# Patient Record
Sex: Male | Born: 1965 | Race: Black or African American | Hispanic: No | Marital: Married | Smoking: Never smoker
Health system: Southern US, Community
[De-identification: ages and names within clinical notes are randomized; demographics above are authoritative.]

## PROBLEM LIST (undated history)

## (undated) DIAGNOSIS — I10 Essential (primary) hypertension: Secondary | ICD-10-CM

## (undated) DIAGNOSIS — K219 Gastro-esophageal reflux disease without esophagitis: Secondary | ICD-10-CM

## (undated) HISTORY — PX: OTHER SURGICAL HISTORY: SHX169

---

## 1998-04-24 ENCOUNTER — Ambulatory Visit (HOSPITAL_BASED_OUTPATIENT_CLINIC_OR_DEPARTMENT_OTHER): Admission: RE | Admit: 1998-04-24 | Discharge: 1998-04-24 | Payer: Self-pay | Admitting: *Deleted

## 2000-06-21 ENCOUNTER — Emergency Department (HOSPITAL_COMMUNITY): Admission: EM | Admit: 2000-06-21 | Discharge: 2000-06-21 | Payer: Self-pay | Admitting: Emergency Medicine

## 2000-06-21 ENCOUNTER — Encounter: Payer: Self-pay | Admitting: Emergency Medicine

## 2001-05-06 ENCOUNTER — Emergency Department (HOSPITAL_COMMUNITY): Admission: EM | Admit: 2001-05-06 | Discharge: 2001-05-06 | Payer: Self-pay

## 2001-05-08 ENCOUNTER — Emergency Department (HOSPITAL_COMMUNITY): Admission: EM | Admit: 2001-05-08 | Discharge: 2001-05-09 | Payer: Self-pay | Admitting: Emergency Medicine

## 2002-03-14 ENCOUNTER — Ambulatory Visit (HOSPITAL_COMMUNITY): Admission: RE | Admit: 2002-03-14 | Discharge: 2002-03-14 | Payer: Self-pay | Admitting: Family Medicine

## 2002-03-14 ENCOUNTER — Encounter: Payer: Self-pay | Admitting: Family Medicine

## 2002-03-23 ENCOUNTER — Emergency Department (HOSPITAL_COMMUNITY): Admission: EM | Admit: 2002-03-23 | Discharge: 2002-03-23 | Payer: Self-pay | Admitting: Emergency Medicine

## 2007-08-22 ENCOUNTER — Emergency Department (HOSPITAL_COMMUNITY): Admission: EM | Admit: 2007-08-22 | Discharge: 2007-08-23 | Payer: Self-pay | Admitting: Emergency Medicine

## 2010-07-27 ENCOUNTER — Emergency Department (HOSPITAL_COMMUNITY): Admission: EM | Admit: 2010-07-27 | Discharge: 2010-07-27 | Payer: Self-pay | Admitting: Emergency Medicine

## 2010-07-30 ENCOUNTER — Emergency Department (HOSPITAL_COMMUNITY): Admission: EM | Admit: 2010-07-30 | Discharge: 2010-07-30 | Payer: Self-pay | Admitting: Family Medicine

## 2011-03-01 ENCOUNTER — Inpatient Hospital Stay (INDEPENDENT_AMBULATORY_CARE_PROVIDER_SITE_OTHER)
Admission: RE | Admit: 2011-03-01 | Discharge: 2011-03-01 | Disposition: A | Discharge status: Home / Self Care | Payer: Managed Care, Other (non HMO) | Source: Ambulatory Visit | Attending: Emergency Medicine | Admitting: Emergency Medicine

## 2011-03-01 DIAGNOSIS — J40 Bronchitis, not specified as acute or chronic: Secondary | ICD-10-CM

## 2011-03-08 ENCOUNTER — Other Ambulatory Visit (HOSPITAL_COMMUNITY): Payer: Self-pay | Admitting: Family Medicine

## 2011-03-08 ENCOUNTER — Ambulatory Visit (HOSPITAL_COMMUNITY)
Admission: RE | Admit: 2011-03-08 | Discharge: 2011-03-08 | Disposition: A | Discharge status: Home / Self Care | Payer: Managed Care, Other (non HMO) | Source: Ambulatory Visit | Attending: Family Medicine | Admitting: Family Medicine

## 2011-03-08 DIAGNOSIS — R059 Cough, unspecified: Secondary | ICD-10-CM | POA: Insufficient documentation

## 2011-03-08 DIAGNOSIS — R05 Cough: Secondary | ICD-10-CM

## 2011-04-02 ENCOUNTER — Institutional Professional Consult (permissible substitution): Payer: Managed Care, Other (non HMO) | Admitting: Internal Medicine

## 2011-04-08 ENCOUNTER — Encounter: Payer: Self-pay | Admitting: Internal Medicine

## 2011-04-08 ENCOUNTER — Encounter: Payer: Self-pay | Admitting: *Deleted

## 2011-04-08 ENCOUNTER — Ambulatory Visit (INDEPENDENT_AMBULATORY_CARE_PROVIDER_SITE_OTHER): Payer: Managed Care, Other (non HMO) | Admitting: Internal Medicine

## 2011-04-08 VITALS — BP 140/88 | HR 68 | Temp 98.5°F | Ht 69.0 in | Wt 229.8 lb

## 2011-04-08 DIAGNOSIS — R05 Cough: Secondary | ICD-10-CM

## 2011-04-08 DIAGNOSIS — R059 Cough, unspecified: Secondary | ICD-10-CM | POA: Insufficient documentation

## 2011-04-08 MED ORDER — PREDNISONE (PAK) 10 MG PO TABS: ORAL_TABLET | ORAL | Status: AC

## 2011-04-08 MED ORDER — FAMOTIDINE 20 MG PO TABS: ORAL_TABLET | ORAL | Status: DC

## 2011-04-08 MED ORDER — OMEPRAZOLE MAGNESIUM 20 MG PO TBEC: DELAYED_RELEASE_TABLET | ORAL | Status: DC

## 2011-04-08 MED ORDER — CETIRIZINE HCL 10 MG PO CHEW: CHEWABLE_TABLET | ORAL | Status: DC

## 2011-04-08 NOTE — Assessment & Plan Note (Signed)
The most common causes of chronic cough in immunocompetent adults include the following: upper airway cough syndrome (UACS), previously referred to as postnasal drip syndrome (PNDS), which is caused by variety of rhinosinus conditions; (2) asthma; (3) GERD; (4) chronic bronchitis from cigarette smoking or other inhaled environmental irritants; (5) nonasthmatic eosinophilic bronchitis; and (6) bronchiectasis.   These conditions, singly or in combination, have accounted for up to 94% of the causes of chronic cough in prospective studies.   Other conditions have constituted no >6% of the causes in prospective studies These have included bronchogenic carcinoma, chronic interstitial pneumonia, sarcoidosis, left ventricular failure, ACEI-induced cough, and aspiration from a condition associated with pharyngeal dysfunction.  Of the three most common causes of chronic cough, only one (GERD)  can actually cause the other two (asthma and post nasal drip syndrome)  and perpetuate the cylce of cough inducing airway trauma, inflammation, heightened sensitivity to reflux which is prompted by the cough itself via a cyclical mechanism.    This may partially respond to steroids and look like asthma and post nasal drainage but never erradicated completely unless the cough and the secondary reflux are eliminated, preferably both at the same time.  While not intuitively obvious, many patients with chronic low grade reflux do not cough until there is a secondary insult that disturbs the protective epithelial barrier and exposes sensitive nerve endings.  This can be viral or direct physical injury such as with an endotracheal tube.   The point is that once this occurs, it is difficult to eliminate using anything but a maximally effective acid suppression regimen at least in the short run, accompanied by an appropriate diet to address non acid GERD.   See instructions for specific recommendations which were reviewed directly  with the patient who was given a copy with highlighter outlining the key components.

## 2011-04-08 NOTE — Patient Instructions (Signed)
Prednisone 10 mg take  4 each am x 2 days,   2 each am x 2 days,  1 each am x2days and stop   Stop allegra and taking Zyrtec 10 mg one at bedtime as needed for itching sneezing runny nose   Prilosec 20 mg take 2  30-60 min before bfast and pepcid 20mg  at bedtime as long as you are coughing (reflux is to cough what oxygen is to fire)   GERD (REFLUX)  is an extremely common cause of respiratory symptoms, many times with no significant heartburn at all.    It can be treated with medication, but also with lifestyle changes including avoidance of late meals, excessive alcohol, smoking cessation, and avoid fatty foods, chocolate, peppermint, colas, red wine, and acidic juices such as orange juice.  NO MINT OR MENTHOL PRODUCTS SO NO COUGH DROPS  USE SUGARLESS CANDY INSTEAD (jolley ranchers or Stover's)  NO OIL BASED VITAMINS  NO colognes   If you are satisfied with your treatment plan let your doctor know and he/she can either refill your medications or you can return here when your prescription runs out.     If in any way you are not 100% satisfied,  please tell us.  If 100% better, tell your friends!

## 2011-04-08 NOTE — Progress Notes (Signed)
Subjective:     Patient ID: Justin Jones, male   DOB: 1966/11/06, 45 y.o.   MRN: [id number]  HPI 73 yobm never smoked new onset itching sneezy runny nose intermittently since 2011 referred by Dr Leonides Sake for a cough to the pulmonary clinic 03/2011  04/08/2011 ov / Sherene Sires  Initial pulmonary office eval  Cc cough sudden onset x one month 24 h day made worse by heat and laughing better p allegra prilosec added by Dr Tiburcio Pea.  Cough more day than night, no assoc sob.  Pt denies any significant sore throat, dysphagia, itching, sneezing,  nasal congestion or excess/ purulent secretions,  fever, chills, sweats, unintended wt loss, pleuritic or exertional cp, hempoptysis, orthopnea pnd or leg swelling.    Also denies any obvious fluctuation of symptoms with weather or environmental changes or other aggravating or alleviating factors.    Review of Systems     Objective:   Physical Exam amb bm with cough early insp and heavy cologne Wt  229 04/08/2011  HEENT: nl dentition, mod nonspecific bilateral edema of  turbinates, and orophanx. Nl external ear canals without cough reflex   NECK :  without JVD/Nodes/TM/ nl carotid upstrokes bilaterally   LUNGS: no acc muscle use, clear to A and P bilaterally without cough on insp or exp maneuvers   CV:  RRR  no s3 or murmur or increase in P2, no edema   ABD:  soft and nontender with nl excursion in the supine position. No bruits or organomegaly, bowel sounds nl  MS:  warm without deformities, calf tenderness, cyanosis or clubbing  SKIN: warm and dry without lesions    NEURO:  alert, approp, no deficits       cxr 03/08/11 No evidence of active pulmonary disease.  Assessment:         Plan:

## 2011-09-02 LAB — POCT CARDIAC MARKERS
CKMB, poc: 1.3
CKMB, poc: <1 — ABNORMAL LOW
CKMB, poc: <1 — ABNORMAL LOW
Myoglobin, poc: 124
Myoglobin, poc: 92.7
Operator id: 4531
Troponin i, poc: <0.05

## 2011-09-02 LAB — BASIC METABOLIC PANEL
CO2: 25
Calcium: 9.9
Chloride: 106
Glucose, Bld: 99
Sodium: 139

## 2011-09-02 LAB — DIFFERENTIAL
Basophils Absolute: 0
Basophils Relative: 0
Eosinophils Absolute: 0.2
Eosinophils Relative: 2
Monocytes Absolute: 0.6
Monocytes Relative: 5
Neutro Abs: 9.5 — ABNORMAL HIGH

## 2011-09-02 LAB — CBC
Hemoglobin: 12.9 — ABNORMAL LOW
MCHC: 33.5
MCV: 79
RDW: 14.1 — ABNORMAL HIGH

## 2011-12-21 ENCOUNTER — Other Ambulatory Visit: Payer: Self-pay | Admitting: Family Medicine

## 2011-12-21 DIAGNOSIS — M25561 Pain in right knee: Secondary | ICD-10-CM

## 2011-12-24 ENCOUNTER — Inpatient Hospital Stay: Admission: RE | Admit: 2011-12-24 | Payer: Managed Care, Other (non HMO) | Source: Ambulatory Visit

## 2013-07-19 ENCOUNTER — Emergency Department (INDEPENDENT_AMBULATORY_CARE_PROVIDER_SITE_OTHER)
Admission: EM | Admit: 2013-07-19 | Discharge: 2013-07-19 | Disposition: A | Discharge status: Home / Self Care | Payer: 59 | Source: Home / Self Care | Attending: Family Medicine | Admitting: Family Medicine

## 2013-07-19 ENCOUNTER — Encounter (HOSPITAL_COMMUNITY): Payer: Self-pay | Admitting: *Deleted

## 2013-07-19 DIAGNOSIS — R0789 Other chest pain: Secondary | ICD-10-CM

## 2013-07-19 HISTORY — DX: Gastro-esophageal reflux disease without esophagitis: K21.9

## 2013-07-19 MED ORDER — DICLOFENAC POTASSIUM 50 MG PO TABS: 50.0000 mg | ORAL_TABLET | Freq: Three times a day (TID) | ORAL | Status: DC

## 2013-07-19 NOTE — ED Notes (Signed)
Pt  Reports  Chest   Pain  Since  Monday       Mainly  On l  Side    denys  Any  Shortness of  Breath        -  Skin is  Warm  /  Dry           Lungs  Are  Clear      - Capillary  Refill is  Brisk           Pulse  Is  Steady  Strong

## 2013-07-19 NOTE — ED Provider Notes (Signed)
CSN: [id number]     Arrival date & time 07/19/13  1627 History   First MD Initiated Contact with Patient 07/19/13 1652     Chief Complaint  Patient presents with  . Chest Pain   (Consider location/radiation/quality/duration/timing/severity/associated sxs/prior Treatment) Patient is a 47 y.o. male presenting with chest pain. The history is provided by the patient.  Chest Pain Pain location:  L lateral chest Pain quality: sharp   Pain radiates to:  Does not radiate Pain radiates to the back: no   Pain severity:  Mild Onset quality:  Gradual Duration:  4 days Progression:  Unchanged Chronicity:  New Context: movement and raising an arm   Relieved by:  Nothing Ineffective treatments:  None tried Associated symptoms: no cough, no diaphoresis, no fever, no lower extremity edema, no orthopnea, no palpitations and no PND   Risk factors: no hypertension and no smoking     Past Medical History  Diagnosis Date  . GERD (gastroesophageal reflux disease)    Past Surgical History  Procedure Laterality Date  . None     Family History  Problem Relation Age of Onset  . Cirrhosis Father     drinker   History  Substance Use Topics  . Smoking status: Never Smoker   . Smokeless tobacco: Never Used  . Alcohol Use: Yes     Comment: rare    Review of Systems  Constitutional: Negative.  Negative for fever and diaphoresis.  Respiratory: Negative for cough and chest tightness.   Cardiovascular: Positive for chest pain. Negative for palpitations, orthopnea, leg swelling and PND.  Gastrointestinal: Negative.     Allergies  Review of patient's allergies indicates no known allergies.  Home Medications   Current Outpatient Rx  Name  Route  Sig  Dispense  Refill  . cetirizine (ZYRTEC) 10 MG chewable tablet      One tablet at bedtime   30 tablet   2   . diclofenac (CATAFLAM) 50 MG tablet   Oral   Take 1 tablet (50 mg total) by mouth 3 (three) times daily.   30 tablet   0   .  EXPIRED: famotidine (PEPCID) 20 MG tablet      One at bedtime         . Multiple Vitamin (MULTIVITAMIN PO)   Oral   Take 1 tablet by mouth daily.           Marland Kitchen omeprazole (PRILOSEC OTC) 20 MG tablet      Take 2 tablets  30-60 min before first meal of the day          BP 142/76  Pulse 72  Temp(Src) 98.6 F (37 C) (Oral)  Resp 16  SpO2 98% Physical Exam  Nursing note and vitals reviewed. Constitutional: He is oriented to person, place, and time. He appears well-developed and well-nourished. No distress.  HENT:  Head: Normocephalic.  Eyes: Conjunctivae are normal. Pupils are equal, round, and reactive to light.  Neck: Normal range of motion. Neck supple.  Cardiovascular: Normal rate, regular rhythm, normal heart sounds and intact distal pulses.   Pulmonary/Chest: Effort normal and breath sounds normal. He exhibits tenderness.  Localizing palpable cp on left.  Abdominal: Soft. Bowel sounds are normal.  Lymphadenopathy:    He has no cervical adenopathy.  Neurological: He is alert and oriented to person, place, and time.  Skin: Skin is warm and dry.    ED Course  Procedures (including critical care time) Labs Review Labs Reviewed - No  data to display Imaging Review No results found.  MDM   1. Musculoskeletal chest pain   l. ecg--wnl.   Linna Hoff, MD 07/19/13 [phone]

## 2014-01-03 ENCOUNTER — Emergency Department (HOSPITAL_COMMUNITY)
Admission: EM | Admit: 2014-01-03 | Discharge: 2014-01-03 | Disposition: A | Discharge status: Home / Self Care | Payer: 59 | Source: Home / Self Care | Attending: Emergency Medicine | Admitting: Emergency Medicine

## 2014-01-03 ENCOUNTER — Emergency Department (INDEPENDENT_AMBULATORY_CARE_PROVIDER_SITE_OTHER): Payer: 59

## 2014-01-03 ENCOUNTER — Encounter (HOSPITAL_COMMUNITY): Payer: Self-pay | Admitting: Emergency Medicine

## 2014-01-03 DIAGNOSIS — J111 Influenza due to unidentified influenza virus with other respiratory manifestations: Secondary | ICD-10-CM

## 2014-01-03 DIAGNOSIS — J45909 Unspecified asthma, uncomplicated: Secondary | ICD-10-CM

## 2014-01-03 DIAGNOSIS — R69 Illness, unspecified: Secondary | ICD-10-CM

## 2014-01-03 MED ORDER — PREDNISONE 20 MG PO TABS: ORAL_TABLET | ORAL | Status: DC

## 2014-01-03 MED ORDER — ALBUTEROL SULFATE HFA 108 (90 BASE) MCG/ACT IN AERS: 1.0000 | INHALATION_SPRAY | Freq: Four times a day (QID) | RESPIRATORY_TRACT | Status: DC | PRN

## 2014-01-03 MED ORDER — ACETAMINOPHEN 325 MG PO TABS
650.0000 mg | ORAL_TABLET | Freq: Once | ORAL | Status: AC
  Administered 2014-01-03: 650 mg via ORAL

## 2014-01-03 MED ORDER — OSELTAMIVIR PHOSPHATE 75 MG PO CAPS: 75.0000 mg | ORAL_CAPSULE | Freq: Two times a day (BID) | ORAL | Status: DC

## 2014-01-03 MED ORDER — HYDROCOD POLST-CHLORPHEN POLST 10-8 MG/5ML PO LQCR: 5.0000 mL | Freq: Two times a day (BID) | ORAL | Status: DC | PRN

## 2014-01-03 MED ORDER — ACETAMINOPHEN 325 MG PO TABS
ORAL_TABLET | ORAL | Status: AC
  Filled 2014-01-03: qty 2

## 2014-01-03 MED ORDER — ALBUTEROL SULFATE (2.5 MG/3ML) 0.083% IN NEBU: 2.5000 mg | INHALATION_SOLUTION | Freq: Four times a day (QID) | RESPIRATORY_TRACT | Status: DC | PRN

## 2014-01-03 MED ORDER — OXYCODONE-ACETAMINOPHEN 5-325 MG PO TABS: ORAL_TABLET | ORAL | Status: DC

## 2014-01-03 NOTE — Discharge Instructions (Signed)
Most upper respiratory infections are caused by viruses and do not require antibiotics.  We try to save the antibiotics for when we really need them to prevent bacteria from developing resistance to them.  Here are a few hints about things that can be done at home to help get over an upper respiratory infection quicker: ° °Get extra sleep and extra fluids.  Get 7 to 9 hours of sleep per night and 6 to 8 glasses of water a day.  Getting extra sleep keeps the immune system from getting run down.  Most people with an upper respiratory infection are a little dehydrated.  The extra fluids also keep the secretions liquified and easier to deal with.  Also, get extra vitamin C.  4000 mg per day is the recommended dose. °For the aches, headache, and fever, acetaminophen or ibuprofen are helpful.  These can be alternated every 4 hours.  People with liver disease should avoid large amounts of acetaminophen, and people with ulcer disease, gastroesophageal reflux, gastritis, congestive heart failure, chronic kidney disease, coronary artery disease and the elderly should avoid ibuprofen. °For nasal congestion try Mucinex-D, or if you're having lots of sneezing or clear nasal drainage use Zyrtec-D. People with high blood pressure can take these if their blood pressure is controlled, if not, it's best to avoid the forms with a "D" (decongestants).  You can use the plain Mucinex, Allegra, Claritin, or Zyrtec even if your blood pressure is not controlled.   °A Saline nasal spray such as Ocean Spray can also help.  You can add a decongestant sprays such as Afrin, but you should not use the decongestant sprays for more than 3 or 4 days since they can be habituating.  Breathe Rite nasal strips can also offer a non-drug alternative treatment to nasal congestion, especially at night. °For people with symptoms of sinusitis, sleeping with your head elevated can be helpful.  For sinus pain, moist, hot compresses to the face may provide some  relief.  Many people find that inhaling steam as in a shower or from a pot of steaming water can help. °For any viral infection, zinc containing lozenges such as Cold-Eze or Zicam are helpful.  Zinc helps to fight viral infection.  Hot salt water gargles (8 oz of hot water, 1/2 tsp of table salt, and a pinch of baking soda) can give relief as well as hot beverages such as hot tea.  Sucrets extra strength lozenges will help the sore throat.  °For the cough, take Delsym 2 tsp every 12 hours.  It has also been found recently that Aleve can help control a cough.  The dose is 1 to 2 tablets twice daily with food.  This can be combined with Delsym. (Note, if you are taking ibuprofen, you should not take Aleve as well--take one or the other.) °A cool mist vaporizer will help keep your mucous membranes from drying out.  ° °It's important when you have an upper respiratory infection not to pass the infection to others.  This involves being very careful about the following: ° °Frequent hand washing or use of hand sanitizer, especially after coughing, sneezing, blowing your nose or touching your face, nose or eyes. °Do not shake hands or touch anyone and try to avoid touching surfaces that other people use such as doorknobs, shopping carts, telephones and computer keyboards. °Use tissues and dispose of them properly in a garbage can or ziplock bag. °Cough into your sleeve. °Do not let others eat or   drink after you. ° °It's also important to recognize the signs of serious illness and get evaluated if they occur: °Any respiratory infection that lasts more than 7 to 10 days.  Yellow nasal drainage and sputum are not reliable indicators of a bacterial infection, but if they last for more than 1 week, see your doctor. °Fever and sore throat can indicate strep. °Fever and cough can indicate influenza or pneumonia. °Any kind of severe symptom such as difficulty breathing, intractable vomiting, or severe pain should prompt you to see  a doctor as soon as possible. ° ° °Your body's immune system is really the thing that will get rid of this infection.  Your immune system is comprised of 2 types of specialized cells called T cells and B cells.  T cells coordinate the array of cells in your body that engulf invading bacteria or viruses while B cells orchestrate the production of antibodies that neutralize infection.  Anything we do or any medications we give you, will just strengthen your immune system or help it clear up the infection quicker.  Here are a few helpful hints to improve your immune system to help overcome this illness or to prevent future infections: °· A few vitamins can improve the health of your immune system.  That's why your diet should include plenty of fruits, vegetables, fish, nuts, and whole grains. °· Vitamin A and bet-carotene can increase the cells that fight infections (T cells and B cells).  Vitamin A is abundant in dark greens and orange vegetables such as spinach, greens, sweet potatoes, and carrots. °· Vitamin B6 contributes to the maturation of white blood cells, the cells that fight disease.  Foods with vitamin B6 include cold cereal and bananas. °· Vitamin C is credited with preventing colds because it increases white blood cells and also prevents cellular damage.  Citrus fruits, peaches and green and red bell peppers are all hight in vitamin C. °· Vitamin E is an anti-oxidant that encourages the production of natural killer cells which reject foreign invaders and B cells that produce antibodies.  Foods high in vitamin E include wheat germ, nuts and seeds. °· Foods high in omega-3 fatty acids found in foods like salmon, tuna and mackerel boost your immune system and help cells to engulf and absorb germs. °· Probiotics are good bacteria that increase your T cells.  These can be found in yogurt and are available in supplements such as Culturelle or Align. °· Moderate exercise increases the strength of your immune  system and your ability to recover from illness.  I suggest 3 to 5 moderate intensity 30 minute workouts per week.   °· Sleep is another component of maintaining a strong immune system.  It enables your body to recuperate from the day's activities, stress and work.  My recommendation is to get between 7 and 9 hours of sleep per night. °· If you smoke, try to quit completely or at least cut down.  Drink alcohol only in moderation if at all.  No more than 2 drinks daily for men or 1 for women. °· Get a flu vaccine early in the fall or if you have not gotten one yet, once this illness has run its course.  If you are over 65, a smoker, or an asthmatic, get a pneumococcal vaccine. °· My final recommendation is to maintain a healthy weight.  Excess weight can impair the immune system by interfering with the way the immune system deals with invading viruses or   bacteria.   Asthma Attack Prevention Although there is no way to prevent asthma from starting, you can take steps to control the disease and reduce its symptoms. Learn about your asthma and how to control it. Take an active role to control your asthma by working with your health care provider to create and follow an asthma action plan. An asthma action plan guides you in:  Taking your medicines properly.  Avoiding things that set off your asthma or make your asthma worse (asthma triggers).  Tracking your level of asthma control.  Responding to worsening asthma.  Seeking emergency care when needed. To track your asthma, keep records of your symptoms, check your peak flow number using a handheld device that shows how well air moves out of your lungs (peak flow meter), and get regular asthma checkups.  WHAT ARE SOME WAYS TO PREVENT AN ASTHMA ATTACK?  Take medicines as directed by your health care provider.  Keep track of your asthma symptoms and level of control.  With your health care provider, write a detailed plan for taking medicines and  managing an asthma attack. Then be sure to follow your action plan. Asthma is an ongoing condition that needs regular monitoring and treatment.  Identify and avoid asthma triggers. Many outdoor allergens and irritants (such as pollen, mold, cold air, and air pollution) can trigger asthma attacks. Find out what your asthma triggers are and take steps to avoid them.  Monitor your breathing. Learn to recognize warning signs of an attack, such as coughing, wheezing, or shortness of breath. Your lung function may decrease before you notice any signs or symptoms, so regularly measure and record your peak airflow with a home peak flow meter.  Identify and treat attacks early. If you act quickly, you are less likely to have a severe attack. You will also need less medicine to control your symptoms. When your peak flow measurements decrease and alert you to an upcoming attack, take your medicine as instructed and immediately stop any activity that may have triggered the attack. If your symptoms do not improve, get medical help.  Pay attention to increasing quick-relief inhaler use. If you find yourself relying on your quick-relief inhaler, your asthma is not under control. See your health care provider about adjusting your treatment. WHAT CAN MAKE MY SYMPTOMS WORSE? A number of common things can set off or make your asthma symptoms worse and cause temporary increased inflammation of your airways. Keep track of your asthma symptoms for several weeks, detailing all the environmental and emotional factors that are linked with your asthma. When you have an asthma attack, go back to your asthma diary to see which factor, or combination of factors, might have contributed to it. Once you know what these factors are, you can take steps to control many of them. If you have allergies and asthma, it is important to take asthma prevention steps at home. Minimizing contact with the substance to which you are allergic will help  prevent an asthma attack. Some triggers and ways to avoid these triggers are: Animal Dander:  Some people are allergic to the flakes of skin or dried saliva from animals with fur or feathers.   There is no such thing as a hypoallergenic dog or cat breed. All dogs or cats can cause allergies, even if they don't shed.  Keep these pets out of your home.  If you are not able to keep a pet outdoors, keep the pet out of your bedroom and other  sleeping areas at all times, and keep the door closed.  Remove carpets and furniture covered with cloth from your home. If that is not possible, keep the pet away from fabric-covered furniture and carpets. Dust Mites: Many people with asthma are allergic to dust mites. Dust mites are tiny bugs that are found in every home in mattresses, pillows, carpets, fabric-covered furniture, bedcovers, clothes, stuffed toys, and other fabric-covered items.   Cover your mattress in a special dust-proof cover.  Cover your pillow in a special dust-proof cover, or wash the pillow each week in hot water. Water must be hotter than 130 F (54.4 C) to kill dust mites. Cold or warm water used with detergent and bleach can also be effective.  Wash the sheets and blankets on your bed each week in hot water.  Try not to sleep or lie on cloth-covered cushions.  Call ahead when traveling and ask for a smoke-free hotel room. Bring your own bedding and pillows in case the hotel only supplies feather pillows and down comforters, which may contain dust mites and cause asthma symptoms.  Remove carpets from your bedroom and those laid on concrete, if you can.  Keep stuffed toys out of the bed, or wash the toys weekly in hot water or cooler water with detergent and bleach. Cockroaches: Many people with asthma are allergic to the droppings and remains of cockroaches.   Keep food and garbage in closed containers. Never leave food out.  Use poison baits, traps, powders, gels, or paste  (for example, boric acid).  If a spray is used to kill cockroaches, stay out of the room until the odor goes away. Indoor Mold:  Fix leaky faucets, pipes, or other sources of water that have mold around them.  Clean floors and moldy surfaces with a fungicide or diluted bleach.  Avoid using humidifiers, vaporizers, or swamp coolers. These can spread molds through the air. Pollen and Outdoor Mold:  When pollen or mold spore counts are high, try to keep your windows closed.  Stay indoors with windows closed from late morning to afternoon. Pollen and some mold spore counts are highest at that time.  Ask your health care provider whether you need to take anti-inflammatory medicine or increase your dose of the medicine before your allergy season starts. Other Irritants to Avoid:  Tobacco smoke is an irritant. If you smoke, ask your health care provider how you can quit. Ask family members to quit smoking too. Do not allow smoking in your home or car.  If possible, do not use a wood-burning stove, kerosene heater, or fireplace. Minimize exposure to all sources of smoke, including to incense, candles, fires, and fireworks.  Try to stay away from strong odors and sprays, such as perfume, talcum powder, hair spray, and paints.  Decrease humidity in your home and use an indoor air cleaning device. Reduce indoor humidity to below 60%. Dehumidifiers or central air conditioners can do this.  Decrease house dust exposure by changing furnace and air cooler filters frequently.  Try to have someone else vacuum for you once or twice a week. Stay out of rooms while they are being vacuumed and for a short while afterward.  If you vacuum, use a dust mask from a hardware store, a double-layered or microfilter vacuum cleaner bag, or a vacuum cleaner with a HEPA filter.  Sulfites in foods and beverages can be irritants. Do not drink beer or wine or eat dried fruit, processed potatoes, or shrimp if they cause  asthma symptoms.  Cold air can trigger an asthma attack. Cover your nose and mouth with a scarf on cold or windy days.  Several health conditions can make asthma more difficult to manage, including a runny nose, sinus infections, reflux disease, psychological stress, and sleep apnea. Work with your health care provider to manage these conditions.  Avoid close contact with people who have a respiratory infection such as a cold or the flu, since your asthma symptoms may get worse if you catch the infection. Wash your hands thoroughly after touching items that may have been handled by people with a respiratory infection.  Get a flu shot every year to protect against the flu virus, which often makes asthma worse for days or weeks. Also get a pneumonia shot if you have not previously had one. Unlike the flu shot, the pneumonia shot does not need to be given yearly. Medicines:  Talk to your health care provider about whether it is safe for you to take aspirin or non-steroidal anti-inflammatory medicines (NSAIDs). In a small number of people with asthma, aspirin and NSAIDs can cause asthma attacks. These medicines must be avoided by people who have known aspirin-sensitive asthma. It is important that people with aspirin-sensitive asthma read labels of all over-the-counter medicines used to treat pain, colds, coughs, and fever.  Beta blockers and ACE inhibitors are other medicines you should discuss with your health care provider. HOW CAN I FIND OUT WHAT I AM ALLERGIC TO? Ask your asthma health care provider about allergy skin testing or blood testing (the RAST test) to identify the allergens to which you are sensitive. If you are found to have allergies, the most important thing to do is to try to avoid exposure to any allergens that you are sensitive to as much as possible. Other treatments for allergies, such as medicines and allergy shots (immunotherapy) are available.  CAN I EXERCISE? Follow your  health care provider's advice regarding asthma treatment before exercising. It is important to maintain a regular exercise program, but vigorous exercise, or exercise in cold, humid, or dry environments can cause asthma attacks, especially for those people who have exercise-induced asthma. Document Released: 10/27/2009 Document Revised: 07/11/2013 Document Reviewed: 05/16/2013 [facility] Patient Information 2014 [employer], [place].  How to Use an Inhaler Proper inhaler technique is very important. Good technique ensures that the medicine reaches the lungs. Poor technique results in depositing the medicine on the tongue and back of the throat rather than in the airways. If you do not use the inhaler with good technique, the medicine will not help you. STEPS TO FOLLOW IF USING AN INHALER WITHOUT AN EXTENSION TUBE 1. Remove the cap from the inhaler. 2. If you are using the inhaler for the first time, you will need to prime it. Shake the inhaler for 5 seconds and release four puffs into the air, away from your face. Ask your health care provider or pharmacist if you have questions about priming your inhaler. 3. Shake the inhaler for 5 seconds before each breath in (inhalation). 4. Position the inhaler so that the top of the canister faces up. 5. Put your index finger on the top of the medicine canister. Your thumb supports the bottom of the inhaler. 6. Open your mouth. 7. Either place the inhaler between your teeth and place your lips tightly around the mouthpiece, or hold the inhaler 1 2 inches away from your open mouth. If you are unsure of which technique to use, ask your health care provider. 8.  Breathe out (exhale) normally and as completely as possible. 9. Press the canister down with your index finger to release the medicine. 10. At the same time as the canister is pressed, inhale deeply and slowly until your lungs are completely filled. This should take 4 6 seconds. Keep your tongue down. 11. Hold  the medicine in your lungs for 5 10 seconds (10 seconds is best). This helps the medicine get into the small airways of your lungs. 12. Breathe out slowly, through pursed lips. Whistling is an example of pursed lips. 13. Wait at least 15 30 seconds between puffs. Continue with the above steps until you have taken the number of puffs your health care provider has ordered. Do not use the inhaler more than your health care provider tells you. 14. Replace the cap on the inhaler. 15. Follow the directions from your health care provider or the inhaler insert for cleaning the inhaler. STEPS TO FOLLOW IF USING AN INHALER WITH AN EXTENSION (SPACER) 1. Remove the cap from the inhaler. 2. If you are using the inhaler for the first time, you will need to prime it. Shake the inhaler for 5 seconds and release four puffs into the air, away from your face. Ask your health care provider or pharmacist if you have questions about priming your inhaler. 3. Shake the inhaler for 5 seconds before each breath in (inhalation). 4. Place the open end of the spacer onto the mouthpiece of the inhaler. 5. Position the inhaler so that the top of the canister faces up and the spacer mouthpiece faces you. 6. Put your index finger on the top of the medicine canister. Your thumb supports the bottom of the inhaler and the spacer. 7. Breathe out (exhale) normally and as completely as possible. 8. Immediately after exhaling, place the spacer between your teeth and into your mouth. Close your lips tightly around the spacer. 9. Press the canister down with your index finger to release the medicine. 10. At the same time as the canister is pressed, inhale deeply and slowly until your lungs are completely filled. This should take 4 6 seconds. Keep your tongue down and out of the way. 11. Hold the medicine in your lungs for 5 10 seconds (10 seconds is best). This helps the medicine get into the small airways of your lungs. Exhale. 12. Repeat  inhaling deeply through the spacer mouthpiece. Again hold that breath for up to 10 seconds (10 seconds is best). Exhale slowly. If it is difficult to take this second deep breath through the spacer, breathe normally several times through the spacer. Remove the spacer from your mouth. 13. Wait at least 15 30 seconds between puffs. Continue with the above steps until you have taken the number of puffs your health care provider has ordered. Do not use the inhaler more than your health care provider tells you. 14. Remove the spacer from the inhaler, and place the cap on the inhaler. 15. Follow the directions from your health care provider or the inhaler insert for cleaning the inhaler and spacer. If you are using different kinds of inhalers, use your quick relief medicine to open the airways 10 15 minutes before using a steroid if instructed to do so by your health care provider. If you are unsure which inhalers to use and the order of using them, ask your health care provider, nurse, or respiratory therapist. If you are using a steroid inhaler, always rinse your mouth with water after your last puff, then  gargle and spit out the water. Do not swallow the water. AVOID:  Inhaling before or after starting the spray of medicine. It takes practice to coordinate your breathing with triggering the spray.  Inhaling through the nose (rather than the mouth) when triggering the spray. HOW TO DETERMINE IF YOUR INHALER IS FULL OR NEARLY EMPTY You cannot know when an inhaler is empty by shaking it. A few inhalers are now being made with dose counters. Ask your health care provider for a prescription that has a dose counter if you feel you need that extra help. If your inhaler does not have a counter, ask your health care provider to help you determine the date you need to refill your inhaler. Write the refill date on a calendar or your inhaler canister. Refill your inhaler 7 10 days before it runs out. Be sure to keep an  adequate supply of medicine. This includes making sure it is not expired, and that you have a spare inhaler.  SEEK MEDICAL CARE IF:   Your symptoms are only partially relieved with your inhaler.  You are having trouble using your inhaler.  You have some increase in phlegm. SEEK IMMEDIATE MEDICAL CARE IF:   You feel little or no relief with your inhalers. You are still wheezing and are feeling shortness of breath or tightness in your chest or both.  You have dizziness, headaches, or a fast heart rate.  You have chills, fever, or night sweats.  You have a noticeable increase in phlegm production, or there is blood in the phlegm. MAKE SURE YOU:   Understand these instructions.  Will watch your condition.  Will get help right away if you are not doing well or get worse. Document Released: 11/05/2000 Document Revised: 08/29/2013 Document Reviewed: 06/07/2013 [facility] Patient Information 2014 [place], [place].  How to Use a Nebulizer If you have asthma or other breathing problems, you might need to breathe in (inhale) medicine. This can be done with a nebulizer. A nebulizer is a device that turns liquid medicine into a mist that you can inhale.  There are different kinds of nebulizers. Most are small. With some, you breathe in through a mouthpiece. With others, a mask fits over your nose and mouth. Most nebulizers must be connected to a small air compressor. Some compressors can run on a battery or can be plugged into an electrical outlet. Air is forced through tubing from the compressor to the nebulizer. The forced air changes the liquid into a fine spray. RISKS AND COMPLICATIONS The nebulizer must work properly for it to help your breathing. If the nebulizer does not produce mist, or if foam comes out, this indicates that the nebulizer is not working properly. Sometimes a filter can get clogged, or there might be a problem with the air compressor. Check the instruction booklet that came  with your nebulizer. It should tell you how to fix problems or where to call for help. You should have at least one extra nebulizer at home. That way, you will always have one when you need it.  HOW TO PREPARE BEFORE USING THE NEBULIZER Take these steps before using the nebulizer: 1. Check your medicine. Make sure it has not expired and is not damaged in any way.  2. Wash your hands with soap and water.  3. Put all the parts of your nebulizer on a sturdy, flat surface. Make sure the tubing connects the compressor and the nebulizer.  4. Measure the liquid medicine according to your  health care provider's instructions. Pour it into the nebulizer.  5. Attach the mouthpiece or mask.  6. Test the nebulizer by turning it on to make sure a spray is coming out. Then, turn it off.  HOW TO USE THE NEBULIZER 1. Sit down and focus on staying relaxed.  2. If your nebulizer has a mask, put it over your nose and mouth. If you use a mouthpiece, put it in your mouth. Press your lips firmly around the mouthpiece.  3. Turn on the nebulizer.  4. Breathe out.  5. Some nebulizers have a finger valve. If yours does, cover up the air hole so the air gets to the nebulizer.  6. Once the medicine begins to mist out, take slow, deep breaths. If there is a finger valve, release it at the end of your breath.  7. Continue taking slow, deep breaths until the nebulizer is empty.  Be sure to stop the machine at any point if you start coughing or if the medicine foams or bubbles. HOW TO CLEAN THE NEBULIZER  The nebulizer and all its parts must be kept very clean. Follow the manufacturer's instructions for cleaning. For most nebulizers, you should follow these guidelines:  Wash the nebulizer after each use. Use warm water and soap. Rinse it well. Shake the nebulizer to remove extra water. Put it on a clean towel until it is completely dry. To make sure it is dry, put the nebulizer back together. Turn on the compressor  for a few minutes. This will blow air through the nebulizer.   Do not wash the tubing or the finger valve.   Store the nebulizer in a dust-free place.   Inspect the filter every week. Replace it any time it looks dirty.   Sometimes the nebulizer will need a more complete cleaning. The instruction booklet should say how often you need to do this. SEEK MEDICAL CARE IF:   You continue to have difficulty breathing.   You have trouble using the nebulizer.  Document Released: 10/27/2009 Document Revised: 07/11/2013 Document Reviewed: 04/30/2013 [facility] Patient Information 2014 [place], [place].  Influenza, Adult Influenza ("the flu") is a viral infection of the respiratory tract. It occurs more often in winter months because people spend more time in close contact with one another. Influenza can make you feel very sick. Influenza easily spreads from person to person (contagious). CAUSES  Influenza is caused by a virus that infects the respiratory tract. You can catch the virus by breathing in droplets from an infected person's cough or sneeze. You can also catch the virus by touching something that was recently contaminated with the virus and then touching your mouth, nose, or eyes. SYMPTOMS  Symptoms typically last 4 to 10 days and may include:  Fever.  Chills.  Headache, body aches, and muscle aches.  Sore throat.  Chest discomfort and cough.  Poor appetite.  Weakness or feeling tired.  Dizziness.  Nausea or vomiting. DIAGNOSIS  Diagnosis of influenza is often made based on your history and a physical exam. A nose or throat swab test can be done to confirm the diagnosis. RISKS AND COMPLICATIONS You may be at risk for a more severe case of influenza if you smoke cigarettes, have diabetes, have chronic heart disease (such as heart failure) or lung disease (such as asthma), or if you have a weakened immune system. Elderly people and pregnant women are also at risk for more  serious infections. The most common complication of influenza is a lung  infection (pneumonia). Sometimes, this complication can require emergency medical care and may be life-threatening. PREVENTION  An annual influenza vaccination (flu shot) is the best way to avoid getting influenza. An annual flu shot is now routinely recommended for all adults in the [place] TREATMENT  In mild cases, influenza goes away on its own. Treatment is directed at relieving symptoms. For more severe cases, your caregiver may prescribe antiviral medicines to shorten the sickness. Antibiotic medicines are not effective, because the infection is caused by a virus, not by bacteria. HOME CARE INSTRUCTIONS  Only take over-the-counter or prescription medicines for pain, discomfort, or fever as directed by your caregiver.  Use a cool mist humidifier to make breathing easier.  Get plenty of rest until your temperature returns to normal. This usually takes 3 to 4 days.  Drink enough fluids to keep your urine clear or pale yellow.  Cover your mouth and nose when coughing or sneezing, and wash your hands well to avoid spreading the virus.  Stay home from work or school until your fever has been gone for at least 1 full day. SEEK MEDICAL CARE IF:   You have chest pain or a deep cough that worsens or produces more mucus.  You have nausea, vomiting, or diarrhea. SEEK IMMEDIATE MEDICAL CARE IF:   You have difficulty breathing, shortness of breath, or your skin or nails turn bluish.  You have severe neck pain or stiffness.  You have a severe headache, facial pain, or earache.  You have a worsening or recurring fever.  You have nausea or vomiting that cannot be controlled. MAKE SURE YOU:  Understand these instructions.  Will watch your condition.  Will get help right away if you are not doing well or get worse. Document Released: 11/05/2000 Document Revised: 05/09/2012 Document Reviewed: 02/07/2012 [facility] Patient  Information 2014 [place], [place].

## 2014-01-03 NOTE — ED Provider Notes (Signed)
Chief Complaint   Chief Complaint  Patient presents with  . Cough    History of Present Illness   Justin Jones is a 48 year old male who's had a 4 week history of a dry cough. He denies any wheezing, chest tightness, or shortness of breath. He has a history of asthma in the distant past but none recently. He has not had any fever, chills, postnasal drip, or symptoms of reflux. Over the past 24 hours he's felt achy all over, had achiness in his left rib cage area, headache, subjective fever, nasal congestion, rhinorrhea, and nausea. He denies any sore throat. He denies any sick exposures.  Review of Systems   Other than as noted above, the patient denies any of the following symptoms: Systemic:  No fevers, chills, sweats, weight loss or gain, or fatigue. ENT:  No nasal congestion, sneezing, itching, postnasal drip, sinus pressure, headache, sore throat, or hoarseness. Lungs:  No wheezing, shortness of breath, chest tightness or congestion. Heart:  No chest pain, tightness, pressure, PND, orthopnea, or ankle edema. GI:  No indigestion, heartburn, waterbrash, burping, abdominal pain, nausea, or vomiting.  PMFSH   Past medical history, family history, social history, meds, and allergies were reviewed.  Specifically, there is no history of allergies, reflux esophagitis or cigarette smoking.   Physical Examination     Vital signs:  BP 135/87  Pulse 101  Temp(Src) 99.9 F (37.7 C) (Oral)  Resp 16  SpO2 99% General:  Alert and oriented.  In no distress.  Skin warm and dry. ENT: TMs and ear canals normal.  Nasal mucosa normal, without drainage.  Pharynx clear without exudate or drainage.  No intraoral lesions. Neck:  No adenopathy, tenderness or mass.  No JVD. Lungs:  No respiratory distress.  Breath sounds clear and equal bilaterally.  No wheezes, rales or rhonchi. Heart:  Regular rhythm, no gallops or murmers.  No pedal edema. Abdomon:  Soft and nontender.  No organomegaly or  mass.  Radiology   Dg Chest 2 View  01/03/2014   CLINICAL DATA:  Cough.  EXAM: CHEST  2 VIEW  COMPARISON:  DG CHEST 2 VIEW dated 03/08/2011  FINDINGS: The heart size and mediastinal contours are within normal limits. Both lungs are clear. The visualized skeletal structures are unremarkable.  IMPRESSION: No active cardiopulmonary disease.   Electronically Signed   By: Salome HolmesHector  Cooper M.D.   On: 01/03/2014 14:49   Assessment   The primary encounter diagnosis was Asthma. A diagnosis of Influenza-like illness was also pertinent to this visit.  I think he has a chronic asthmatic condition, possibly brought on by a virus or by allergies. On top of this he appears to have an influenza-like illness.  Plan     1.  Meds:  The following meds were prescribed:   Discharge Medication List as of 01/03/2014  3:34 PM    START taking these medications   Details  albuterol (PROVENTIL HFA;VENTOLIN HFA) 108 (90 BASE) MCG/ACT inhaler Inhale 1-2 puffs into the lungs every 6 (six) hours as needed for wheezing or shortness of breath., Starting 01/03/2014, Until Discontinued, Normal    albuterol (PROVENTIL) (2.5 MG/3ML) 0.083% nebulizer solution Take 3 mLs (2.5 mg total) by nebulization every 6 (six) hours as needed for wheezing., Starting 01/03/2014, Until Discontinued, Normal    chlorpheniramine-HYDROcodone (TUSSIONEX) 10-8 MG/5ML LQCR Take 5 mLs by mouth every 12 (twelve) hours as needed for cough., Starting 01/03/2014, Until Discontinued, Normal    oseltamivir (TAMIFLU) 75 MG capsule Take  1 capsule (75 mg total) by mouth every 12 (twelve) hours., Starting 01/03/2014, Until Discontinued, Normal    oxyCODONE-acetaminophen (PERCOCET) 5-325 MG per tablet 1 to 2 tablets every 6 hours as needed for pain., Print    predniSONE (DELTASONE) 20 MG tablet Take 3 daily for 5 days, 2 daily for 5 days, 1 daily for 5 days., Normal        2.  Patient Education/Counseling:  The patient was given appropriate handouts, self care  instructions, and instructed in symptomatic relief.    3.  Follow up:  The patient was told to follow up here if no better in 3 to 4 days, or sooner if becoming worse in any way, and given some red flag symptoms such as difficulty breathing or chest pain which would prompt immediate return.  Follow up here or with his primary care physician if needed.       Reuben Likes, MD 01/03/14 [phone]

## 2014-01-03 NOTE — ED Notes (Signed)
Pt  Has     A  persistant  Cough    For  About  43month       He  Reports   Recently  Developed  Body  Aches   And  Pain in  Ribs

## 2014-04-16 ENCOUNTER — Other Ambulatory Visit: Payer: Self-pay | Admitting: Family Medicine

## 2014-04-16 ENCOUNTER — Ambulatory Visit
Admission: RE | Admit: 2014-04-16 | Discharge: 2014-04-16 | Disposition: A | Discharge status: Home / Self Care | Payer: 59 | Source: Ambulatory Visit | Attending: Family Medicine | Admitting: Family Medicine

## 2014-04-16 DIAGNOSIS — R05 Cough: Secondary | ICD-10-CM

## 2014-04-16 DIAGNOSIS — R059 Cough, unspecified: Secondary | ICD-10-CM

## 2014-04-29 ENCOUNTER — Ambulatory Visit (INDEPENDENT_AMBULATORY_CARE_PROVIDER_SITE_OTHER): Payer: 59 | Admitting: Internal Medicine

## 2014-04-29 ENCOUNTER — Encounter: Payer: Self-pay | Admitting: Internal Medicine

## 2014-04-29 VITALS — BP 134/84 | HR 92 | Temp 98.2°F | Ht 69.0 in | Wt 250.8 lb

## 2014-04-29 DIAGNOSIS — R059 Cough, unspecified: Secondary | ICD-10-CM

## 2014-04-29 DIAGNOSIS — R05 Cough: Secondary | ICD-10-CM

## 2014-04-29 MED ORDER — PREDNISONE 10 MG PO TABS: ORAL_TABLET | ORAL | Status: DC

## 2014-04-29 MED ORDER — OMEPRAZOLE MAGNESIUM 20 MG PO TBEC: DELAYED_RELEASE_TABLET | ORAL | Status: DC

## 2014-04-29 MED ORDER — FAMOTIDINE 20 MG PO TABS: ORAL_TABLET | ORAL | Status: DC

## 2014-04-29 MED ORDER — TRAMADOL HCL 50 MG PO TABS: ORAL_TABLET | ORAL | Status: DC

## 2014-04-29 NOTE — Patient Instructions (Signed)
Prednisone 10 mg take  4 each am x 2 days,   2 each am x 2 days,  1 each am x 2 days and stop   Zyrtec 10 mg one at bedtime as long as you are coughing at all or tickling in throat  Prilosec 20 mg take 2  30-60 min before bfast and pepcid 20mg  at bedtime as long as you are coughing    Take delsym two tsp every 12 hours and supplement if needed with  tramadol 50 mg up to 2 every 4 hours to suppress the urge to cough. Swallowing water or using ice chips/non mint and menthol containing candies (such as lifesavers or sugarless jolly ranchers) are also effective.  You should rest your voice and avoid activities that you know make you cough.  Once you have eliminated the cough for 3 straight days try reducing the tramadol first,  then the delsym as tolerated.    GERD (REFLUX)  is an extremely common cause of respiratory symptoms, many times with no significant heartburn at all.    It can be treated with medication, but also with lifestyle changes including avoidance of late meals, excessive alcohol, smoking cessation, and avoid fatty foods, chocolate, peppermint, colas, red wine, and acidic juices such as orange juice.  NO MINT OR MENTHOL PRODUCTS SO NO COUGH DROPS  USE SUGARLESS CANDY INSTEAD (jolley ranchers or Stover's)  NO OIL BASED VITAMINS  NO colognes

## 2014-04-29 NOTE — Progress Notes (Signed)
Subjective:    Patient ID: CARION SWEGER, male    DOB: 03-Sep-1966  MRN: [id number]  HPI  32 yobm never smoked new onset itching sneezy runny nose intermittently since 2011 referred by Dr Leonides Sake for   cough to the pulmonary clinic 03/2011 originally and referred back 04/29/2014 by Dr Risa Grill after failing to respond to cough suppression/ saba/ gerd rx    04/08/2011 ov / Sherene Sires Initial pulmonary office eval Cc cough sudden onset x one month 24 h day made worse by heat and laughing better p allegra prilosec added by Dr Tiburcio Pea. Cough more day than night, no assoc sob.   rec rx as UACS> 100% resolution   04/29/2014 consultation /Alante Tolan re: recurrent cough  Chief Complaint  Patient presents with  . Pulmonary Consult    Referred per Dr. Tally Joe. Pt c/o cough on and off for the past 6 months. Cough is non prod and gets worse when he gets hot.   whenever catch cold gets in chest/ no sob assoc, day >> noct or early am     Kouffman Reflux v Neurogenic Cough Differentiator Reflux Comments  Do you awaken from a sound sleep coughing violently?                            With trouble breathing? sometimes   Do you have choking episodes when you cannot  Get enough air, gasping for air ?              Gets dizzy   Do you usually cough when you lie down into  The bed, or when you just lie down to rest ?                          no   Do you usually cough after meals or eating?         No    Do you cough when (or after) you bend over?    no   GERD SCORE     Kouffman Reflux v Neurogenic Cough Differentiator Neurogenic   Do you more-or-less cough all day long? yes   Does change of temperature make you cough? Yes/hot   Does laughing or chuckling cause you to cough? yes   Do fumes (perfume, automobile fumes, burned  Toast, etc.,) cause you to cough ?      yes   Does speaking, singing, or talking on the phone cause you to cough   ?               No    Neurogenic/Airway score      No obvious  other patterns in day to day or daytime variabilty or assoc chronic cough or cp or chest tightness, subjective wheeze overt sinus or hb symptoms. No unusual exp hx or h/o childhood pna/ asthma or knowledge of premature birth.  Sleeping ok without nocturnal  or early am exacerbation  of respiratory  c/o's or need for noct saba. Also denies any obvious fluctuation of symptoms with weather or environmental changes or other aggravating or alleviating factors except as outlined above   Current Medications, Allergies, Complete Past Medical History, Past Surgical History, Family History, and Social History were reviewed in [employer] record.            Review of Systems  Constitutional: Negative for fever, chills, activity change, appetite change and unexpected  weight change.  HENT: Negative for congestion, dental problem, postnasal drip, rhinorrhea, sneezing, sore throat, trouble swallowing and voice change.   Eyes: Negative for visual disturbance.  Respiratory: Positive for cough. Negative for choking and shortness of breath.   Cardiovascular: Negative for chest pain and leg swelling.  Gastrointestinal: Negative for nausea, vomiting and abdominal pain.  Genitourinary: Negative for difficulty urinating.  Musculoskeletal: Negative for arthralgias.  Skin: Negative for rash.  Psychiatric/Behavioral: Negative for behavioral problems and confusion.       Objective:   Physical Exam   amb bm with cough early insp severe/hacking    Wt 229 04/08/2011 > 04/29/2014 251   HEENT: nl dentition, mod nonspecific bilateral edema of turbinates, and orophanx. Nl external ear canals without cough reflex  NECK : without JVD/Nodes/TM/ nl carotid upstrokes bilaterally  LUNGS: no acc muscle use, clear to A and P bilaterally without cough on insp or exp maneuvers  CV: RRR no s3 or murmur or increase in P2, no edema  ABD: soft and nontender with nl excursion in the supine position. No bruits  or organomegaly, bowel sounds nl  MS: warm without deformities, calf tenderness, cyanosis or clubbing  SKIN: warm and dry without lesions  NEURO: alert, approp, no deficits      04/15/14 There is no edema or consolidation. The heart size and pulmonary  vascularity are normal. No pneumothorax. No adenopathy. No bone  lesions.      Assessment & Plan:

## 2014-04-29 NOTE — Assessment & Plan Note (Signed)

## 2014-05-01 ENCOUNTER — Institutional Professional Consult (permissible substitution): Payer: 59 | Admitting: Internal Medicine

## 2014-05-18 ENCOUNTER — Other Ambulatory Visit: Payer: Self-pay | Admitting: Internal Medicine

## 2014-11-09 ENCOUNTER — Other Ambulatory Visit: Payer: Self-pay | Admitting: Internal Medicine

## 2014-11-26 ENCOUNTER — Other Ambulatory Visit: Payer: Self-pay | Admitting: Internal Medicine

## 2016-10-21 ENCOUNTER — Encounter (HOSPITAL_COMMUNITY): Payer: Self-pay | Admitting: *Deleted

## 2016-10-21 ENCOUNTER — Emergency Department (HOSPITAL_COMMUNITY)
Admission: EM | Admit: 2016-10-21 | Discharge: 2016-10-21 | Disposition: A | Discharge status: Home / Self Care | Payer: Commercial Managed Care - HMO | Attending: Emergency Medicine | Admitting: Emergency Medicine

## 2016-10-21 ENCOUNTER — Emergency Department (HOSPITAL_COMMUNITY): Payer: Commercial Managed Care - HMO

## 2016-10-21 DIAGNOSIS — R0789 Other chest pain: Secondary | ICD-10-CM | POA: Insufficient documentation

## 2016-10-21 DIAGNOSIS — R079 Chest pain, unspecified: Secondary | ICD-10-CM | POA: Diagnosis present

## 2016-10-21 LAB — CBC
HCT: 43.4 % (ref 39.0–52.0)
HEMOGLOBIN: 14 g/dL (ref 13.0–17.0)
MCH: 26 pg (ref 26.0–34.0)
MCHC: 32.3 g/dL (ref 30.0–36.0)
MCV: 80.5 fL (ref 78.0–100.0)
PLATELETS: 229 10*3/uL (ref 150–400)
RBC: 5.39 MIL/uL (ref 4.22–5.81)
RDW: 15 % (ref 11.5–15.5)
WBC: 8.9 10*3/uL (ref 4.0–10.5)

## 2016-10-21 LAB — BASIC METABOLIC PANEL
ANION GAP: 9 (ref 5–15)
BUN: 8 mg/dL (ref 6–20)
CALCIUM: 9.6 mg/dL (ref 8.9–10.3)
CO2: 27 mmol/L (ref 22–32)
CREATININE: 1.07 mg/dL (ref 0.61–1.24)
Chloride: 103 mmol/L (ref 101–111)
Glucose, Bld: 95 mg/dL (ref 65–99)
Potassium: 3.9 mmol/L (ref 3.5–5.1)
SODIUM: 139 mmol/L (ref 135–145)

## 2016-10-21 LAB — I-STAT TROPONIN, ED: TROPONIN I, POC: 0 ng/mL (ref 0.00–0.08)

## 2016-10-21 LAB — TROPONIN I

## 2016-10-21 MED ORDER — ASPIRIN 81 MG PO CHEW
324.0000 mg | CHEWABLE_TABLET | Freq: Once | ORAL | Status: AC
  Administered 2016-10-21: 324 mg via ORAL
  Filled 2016-10-21: qty 4

## 2016-10-21 NOTE — ED Triage Notes (Signed)
Pt c/o R sided chest pain onset this morning with intermittent sharp pains. Denies shortness of breath, has had radiation into neck

## 2016-10-21 NOTE — ED Provider Notes (Signed)
[facility]-EMERGENCY DEPT Provider Note   CSN: [id number] Arrival date & time: 10/21/16  1949     History   Chief Complaint Chief Complaint  Patient presents with  . Chest Pain    HPI Justin Jones is a 50 y.o. male.  The history is provided by the patient.  Chest Pain   The current episode started 6 to 12 hours ago. The problem occurs hourly. The problem has not changed since onset.The pain is associated with rest. The pain is present in the lateral region (right). The pain is at a severity of 7/10. The pain is mild. The quality of the pain is described as brief. The pain does not radiate. Pertinent negatives include no abdominal pain, no back pain, no cough, no diaphoresis, no fever, no hemoptysis, no irregular heartbeat, no leg pain, no lower extremity edema, no nausea, no near-syncope, no orthopnea, no palpitations, no PND, no shortness of breath, no sputum production and no vomiting.  Pertinent negatives for past medical history include no seizures.    Past Medical History:  Diagnosis Date  . GERD (gastroesophageal reflux disease)     Patient Active Problem List   Diagnosis Date Noted  . Cough 04/08/2011    Past Surgical History:  Procedure Laterality Date  . none         Home Medications    Prior to Admission medications   Medication Sig Start Date End Date Taking? Authorizing Provider  naproxen (NAPROSYN) 500 MG tablet Take 500 mg by mouth 2 (two) times daily with a meal.   Yes Historical Provider, MD  albuterol (PROVENTIL HFA;VENTOLIN HFA) 108 (90 BASE) MCG/ACT inhaler Inhale 1-2 puffs into the lungs every 6 (six) hours as needed for wheezing or shortness of breath. Patient not taking: Reported on 10/21/2016 01/03/14   Reuben Likesavid C Keller, MD  famotidine (PEPCID) 20 MG tablet One at bedtime Patient not taking: Reported on 10/21/2016 04/08/11 10/21/16  Nyoka CowdenMichael B Wert, MD  famotidine (PEPCID) 20 MG tablet One at bedtime Patient not taking: Reported on 10/21/2016  04/29/14   Nyoka CowdenMichael B Wert, MD  omeprazole (PRILOSEC OTC) 20 MG tablet Take 2 x  30-60 min before first meal of the day Patient not taking: Reported on 10/21/2016 04/29/14   Nyoka CowdenMichael B Wert, MD  predniSONE (DELTASONE) 10 MG tablet Take  4 each am x 2 days,   2 each am x 2 days,  1 each am x 2 days and stop Patient not taking: Reported on 10/21/2016 04/29/14   Nyoka CowdenMichael B Wert, MD  traMADol (ULTRAM) 50 MG tablet TAKE ONE TO TWO TABLETS BY MOUTH EVERY 4 HOURS AS NEEDED FOR COUGH OR PAIN Patient not taking: Reported on 10/21/2016    Nyoka CowdenMichael B Wert, MD    Family History Family History  Problem Relation Age of Onset  . Cirrhosis Father     drinker    Social History Social History  Substance Use Topics  . Smoking status: Never Smoker  . Smokeless tobacco: Never Used  . Alcohol use Yes     Comment: rare     Allergies   Patient has no known allergies.   Review of Systems Review of Systems  Constitutional: Negative for chills, diaphoresis and fever.  HENT: Negative for ear pain and sore throat.   Eyes: Negative for pain and visual disturbance.  Respiratory: Negative for cough, hemoptysis, sputum production and shortness of breath.   Cardiovascular: Positive for chest pain. Negative for palpitations, orthopnea, PND and near-syncope.  Gastrointestinal:  Negative for abdominal pain, nausea and vomiting.  Genitourinary: Negative for dysuria and hematuria.  Musculoskeletal: Negative for arthralgias and back pain.  Skin: Negative for color change and rash.  Neurological: Negative for seizures and syncope.  All other systems reviewed and are negative.    Physical Exam Updated Vital Signs BP 144/91 (BP Location: Right Arm)   Pulse (!) 58   Temp 98.3 F (36.8 C) (Oral)   Resp 22   SpO2 98%   Physical Exam  Constitutional: He is oriented to person, place, and time. He appears well-developed and well-nourished.  HENT:  Head: Normocephalic and atraumatic.  Eyes: Conjunctivae are normal.    Neck: Neck supple.  Cardiovascular: Normal rate, regular rhythm and normal heart sounds.   No murmur heard. Pulmonary/Chest: Effort normal and breath sounds normal. No respiratory distress. He has no wheezes. He has no rales. He exhibits no tenderness.  Abdominal: Soft. He exhibits no distension. There is no tenderness. There is no guarding.  Musculoskeletal: Normal range of motion. He exhibits no edema.  Neurological: He is alert and oriented to person, place, and time.  Skin: Skin is warm and dry.  Psychiatric: He has a normal mood and affect.  Nursing note and vitals reviewed.    ED Treatments / Results  Labs (all labs ordered are listed, but only abnormal results are displayed) Labs Reviewed  BASIC METABOLIC PANEL  CBC  TROPONIN I  I-STAT TROPOININ, ED    EKG  EKG Interpretation  Date/Time:  Thursday October 21 2016 19:53:29 EST Ventricular Rate:  65 PR Interval:  140 QRS Duration: 78 QT Interval:  390 QTC Calculation: 405 R Axis:   69 Text Interpretation:  Normal sinus rhythm Normal ECG bipasic t waves in avl resolved Otherwise no significant change Confirmed by Adela Lank MD, DANIEL [phone]) on 10/21/2016 8:36:14 PM       Radiology Dg Chest 2 View  Result Date: 10/21/2016 CLINICAL DATA:  RIGHT-sided chest pain beginning this morning. Pain radiates to neck. EXAM: CHEST  2 VIEW COMPARISON:  Chest radiograph Apr 16, 2014 FINDINGS: Cardiomediastinal silhouette is normal. No pleural effusions or focal consolidations. Trachea projects midline and there is no pneumothorax. Soft tissue planes and included osseous structures are non-suspicious. Mild RIGHT acromioclavicular osteoarthrosis. IMPRESSION: Normal chest radiograph. Electronically Signed   By: Awilda Metro M.D.   On: 10/21/2016 20:59    Procedures Procedures (including critical care time)  Medications Ordered in ED Medications  aspirin chewable tablet 324 mg (324 mg Oral Given 10/21/16 2039)     Initial  Impression / Assessment and Plan / ED Course  I have reviewed the triage vital signs and the nursing notes.  Pertinent labs & imaging results that were available during my care of the patient were reviewed by me and considered in my medical decision making (see chart for details).  Clinical Course     50 year old male past medical history of wrist tendinitis comes today with right-sided sharp chest pain. It is not associated with exertion meals palpation and arm movement. Brief episodes of sharp pain. No nausea diaphoresis shortness of breath or other symptoms associated with it. He's never had this pain before. No recent trauma. Low concern for PE as his only risk factor is age. Here score is 2, EKG shows sinus rhythm with no acute ischemia interval upper body or arrhythmia. Chest x-ray shows no acute cardiopulmonary pathology. Felt troponin is negative 2. Screening CBC and BMP are unremarkable. Patient has very low risk for ACS  or PE. Not hypertensive low   Final Clinical Impressions(s) / ED Diagnoses   Final diagnoses:  Other chest pain    New Prescriptions Discharge Medication List as of 10/21/2016 10:59 PM       Cherlynn PerchesEric Romeo Zielinski, MD 10/22/16 0035    Melene Planan Floyd, DO 10/22/16 [id number]

## 2017-01-11 DIAGNOSIS — H40033 Anatomical narrow angle, bilateral: Secondary | ICD-10-CM | POA: Diagnosis not present

## 2017-01-11 DIAGNOSIS — H04123 Dry eye syndrome of bilateral lacrimal glands: Secondary | ICD-10-CM | POA: Diagnosis not present

## 2017-01-30 ENCOUNTER — Emergency Department (HOSPITAL_COMMUNITY): Payer: Commercial Managed Care - HMO

## 2017-01-30 ENCOUNTER — Encounter (HOSPITAL_COMMUNITY): Payer: Self-pay | Admitting: Emergency Medicine

## 2017-01-30 ENCOUNTER — Observation Stay (HOSPITAL_COMMUNITY)
Admission: EM | Admit: 2017-01-30 | Discharge: 2017-01-31 | Disposition: A | Discharge status: Home / Self Care | Payer: Commercial Managed Care - HMO | Attending: Internal Medicine | Admitting: Internal Medicine

## 2017-01-30 DIAGNOSIS — R079 Chest pain, unspecified: Secondary | ICD-10-CM

## 2017-01-30 DIAGNOSIS — M940 Chondrocostal junction syndrome [Tietze]: Secondary | ICD-10-CM

## 2017-01-30 DIAGNOSIS — Z79899 Other long term (current) drug therapy: Secondary | ICD-10-CM | POA: Diagnosis not present

## 2017-01-30 DIAGNOSIS — M6281 Muscle weakness (generalized): Secondary | ICD-10-CM | POA: Insufficient documentation

## 2017-01-30 DIAGNOSIS — K219 Gastro-esophageal reflux disease without esophagitis: Secondary | ICD-10-CM | POA: Diagnosis not present

## 2017-01-30 DIAGNOSIS — R0789 Other chest pain: Principal | ICD-10-CM | POA: Insufficient documentation

## 2017-01-30 LAB — CBC
HCT: 44.5 % (ref 39.0–52.0)
HEMATOCRIT: 43.5 % (ref 39.0–52.0)
HEMOGLOBIN: 14.4 g/dL (ref 13.0–17.0)
Hemoglobin: 14.2 g/dL (ref 13.0–17.0)
MCH: 26 pg (ref 26.0–34.0)
MCH: 25.8 pg — ABNORMAL LOW (ref 26.0–34.0)
MCHC: 32.4 g/dL (ref 30.0–36.0)
MCHC: 32.6 g/dL (ref 30.0–36.0)
MCV: 80.3 fL (ref 78.0–100.0)
MCV: 78.9 fL (ref 78.0–100.0)
PLATELETS: 210 10*3/uL (ref 150–400)
Platelets: 243 10*3/uL (ref 150–400)
RBC: 5.54 MIL/uL (ref 4.22–5.81)
RBC: 5.51 MIL/uL (ref 4.22–5.81)
RDW: 14.8 % (ref 11.5–15.5)
RDW: 14.9 % (ref 11.5–15.5)
WBC: 7.2 10*3/uL (ref 4.0–10.5)
WBC: 8.7 10*3/uL (ref 4.0–10.5)

## 2017-01-30 LAB — CREATININE, SERUM
Creatinine, Ser: 0.88 mg/dL (ref 0.61–1.24)
GFR calc Af Amer: >60 mL/min (ref >60)
GFR calc non Af Amer: >60 mL/min (ref >60)

## 2017-01-30 LAB — TROPONIN I
Troponin I: <0.03 ng/mL (ref <0.03)
Troponin I: <0.03 ng/mL (ref <0.03)
Troponin I: <0.03 ng/mL (ref <0.03)

## 2017-01-30 LAB — BASIC METABOLIC PANEL
ANION GAP: 6 (ref 5–15)
BUN: 7 mg/dL (ref 6–20)
CALCIUM: 9.5 mg/dL (ref 8.9–10.3)
CO2: 26 mmol/L (ref 22–32)
CREATININE: 0.96 mg/dL (ref 0.61–1.24)
Chloride: 108 mmol/L (ref 101–111)
GLUCOSE: 94 mg/dL (ref 65–99)
Potassium: 4.2 mmol/L (ref 3.5–5.1)
Sodium: 140 mmol/L (ref 135–145)

## 2017-01-30 LAB — I-STAT TROPONIN, ED: TROPONIN I, POC: 0 ng/mL (ref 0.00–0.08)

## 2017-01-30 MED ORDER — ONDANSETRON HCL 4 MG/2ML IJ SOLN: 4.0000 mg | Freq: Four times a day (QID) | INTRAMUSCULAR | Status: DC | PRN

## 2017-01-30 MED ORDER — HEPARIN SODIUM (PORCINE) 5000 UNIT/ML IJ SOLN
5000.0000 [IU] | Freq: Three times a day (TID) | INTRAMUSCULAR | Status: DC
  Administered 2017-01-30: 5000 [IU] via SUBCUTANEOUS
  Filled 2017-01-30: qty 1

## 2017-01-30 MED ORDER — MORPHINE SULFATE (PF) 4 MG/ML IV SOLN: 2.0000 mg | INTRAVENOUS | Status: DC | PRN

## 2017-01-30 MED ORDER — GI COCKTAIL ~~LOC~~: 30.0000 mL | Freq: Four times a day (QID) | ORAL | Status: DC | PRN

## 2017-01-30 MED ORDER — AMLODIPINE BESYLATE 5 MG PO TABS: 2.5000 mg | ORAL_TABLET | Freq: Every day | ORAL | Status: DC

## 2017-01-30 MED ORDER — ACETAMINOPHEN 325 MG PO TABS: 650.0000 mg | ORAL_TABLET | ORAL | Status: DC | PRN

## 2017-01-30 NOTE — ED Notes (Signed)
Attempted report x1. 

## 2017-01-30 NOTE — ED Notes (Signed)
Admitting provider at bedside.

## 2017-01-30 NOTE — ED Notes (Signed)
Per phlebotomist in triage, pt labs were drawn in triage. Mini lab called and confirmed blood work was obtained, per mini lab, will run i-stat troponin.

## 2017-01-30 NOTE — ED Triage Notes (Signed)
Pt. Stated, I've had chest pain with some extremity weakness and neck stiffness for 3-4 days.

## 2017-01-30 NOTE — ED Notes (Signed)
ED Provider at bedside. 

## 2017-01-30 NOTE — ED Provider Notes (Signed)
[facility]-EMERGENCY DEPT Provider Note   CSN: [id number] Arrival date & time: 01/30/17  1157     History   Chief Complaint Chief Complaint  Patient presents with  . Chest Pain  . Extremity Weakness    HPI Justin Jones is a 51 y.o. male.  51 year old male presents with chest pressure and tightness 3 episodes. Each episodes lasting for possibly 15 minutes and has occurred at rest. No associated nausea vomiting with it. No dyspnea or diaphoresis. Symptoms resolve spontaneously. No prior history of same. Pain/pressure starts in his chest and radiates to his arm. It also is has gone up to his neck. Denies any associated syncope or near-syncope. Denies any loss of function in his upper extremities. No sharp or tearing back pain. No treatment use prior to arrival      Past Medical History:  Diagnosis Date  . GERD (gastroesophageal reflux disease)     Patient Active Problem List   Diagnosis Date Noted  . Cough 04/08/2011    Past Surgical History:  Procedure Laterality Date  . none         Home Medications    Prior to Admission medications   Medication Sig Start Date End Date Taking? Authorizing Provider  albuterol (PROVENTIL HFA;VENTOLIN HFA) 108 (90 BASE) MCG/ACT inhaler Inhale 1-2 puffs into the lungs every 6 (six) hours as needed for wheezing or shortness of breath. Patient not taking: Reported on 10/21/2016 01/03/14   Reuben Likesavid C Keller, MD  famotidine (PEPCID) 20 MG tablet One at bedtime Patient not taking: Reported on 10/21/2016 04/08/11 10/21/16  Nyoka CowdenMichael B Wert, MD  famotidine (PEPCID) 20 MG tablet One at bedtime Patient not taking: Reported on 10/21/2016 04/29/14   Nyoka CowdenMichael B Wert, MD  naproxen (NAPROSYN) 500 MG tablet Take 500 mg by mouth 2 (two) times daily with a meal.    Historical Provider, MD  omeprazole (PRILOSEC OTC) 20 MG tablet Take 2 x  30-60 min before first meal of the day Patient not taking: Reported on 10/21/2016 04/29/14   Nyoka CowdenMichael B Wert, MD  predniSONE  (DELTASONE) 10 MG tablet Take  4 each am x 2 days,   2 each am x 2 days,  1 each am x 2 days and stop Patient not taking: Reported on 10/21/2016 04/29/14   Nyoka CowdenMichael B Wert, MD  traMADol (ULTRAM) 50 MG tablet TAKE ONE TO TWO TABLETS BY MOUTH EVERY 4 HOURS AS NEEDED FOR COUGH OR PAIN Patient not taking: Reported on 10/21/2016    Nyoka CowdenMichael B Wert, MD    Family History Family History  Problem Relation Age of Onset  . Cirrhosis Father     drinker    Social History Social History  Substance Use Topics  . Smoking status: Never Smoker  . Smokeless tobacco: Never Used  . Alcohol use Yes     Comment: rare     Allergies   Patient has no known allergies.   Review of Systems Review of Systems  All other systems reviewed and are negative.    Physical Exam Updated Vital Signs BP 135/79   Pulse 67   Temp 98.1 F (36.7 C) (Oral)   Resp 15   Ht 6' (1.829 m)   Wt 112.9 kg   SpO2 99%   BMI 33.77 kg/m   Physical Exam  Constitutional: He is oriented to person, place, and time. He appears well-developed and well-nourished.  Non-toxic appearance. No distress.  HENT:  Head: Normocephalic and atraumatic.  Eyes: Conjunctivae, EOM and lids  are normal. Pupils are equal, round, and reactive to light.  Neck: Normal range of motion. Neck supple. No tracheal deviation present. No thyroid mass present.  Cardiovascular: Normal rate, regular rhythm and normal heart sounds.  Exam reveals no gallop.   No murmur heard. Pulmonary/Chest: Effort normal and breath sounds normal. No stridor. No respiratory distress. He has no decreased breath sounds. He has no wheezes. He has no rhonchi. He has no rales.  Abdominal: Soft. Normal appearance and bowel sounds are normal. He exhibits no distension. There is no tenderness. There is no rebound and no CVA tenderness.  Musculoskeletal: Normal range of motion. He exhibits no edema or tenderness.  Neurological: He is alert and oriented to person, place, and time. He  has normal strength. No cranial nerve deficit or sensory deficit. GCS eye subscore is 4. GCS verbal subscore is 5. GCS motor subscore is 6.  Skin: Skin is warm and dry. No abrasion and no rash noted.  Psychiatric: He has a normal mood and affect. His speech is normal and behavior is normal.  Nursing note and vitals reviewed.    ED Treatments / Results  Labs (all labs ordered are listed, but only abnormal results are displayed) Labs Reviewed  BASIC METABOLIC PANEL  CBC  I-STAT TROPOININ, ED    EKG  EKG Interpretation  Date/Time:  Sunday January 30 2017 12:03:11 EDT Ventricular Rate:  66 PR Interval:  138 QRS Duration: 78 QT Interval:  384 QTC Calculation: 402 R Axis:   86 Text Interpretation:  Normal sinus rhythm Normal ECG No significant change since last tracing Confirmed by Deyonna Fitzsimmons  MD, Elara Cocke ([id number]) on 01/30/2017 12:17:57 PM       Radiology No results found.  Procedures Procedures (including critical care time)  Medications Ordered in ED Medications - No data to display   Initial Impression / Assessment and Plan / ED Course  I have reviewed the triage vital signs and the nursing notes.  Pertinent labs & imaging results that were available during my care of the patient were reviewed by me and considered in my medical decision making (see chart for details).     Patient's chest x-ray and troponin without acute findings. Due to his symptoms of 3 episodes of chest pain will be admitted for observation  Final Clinical Impressions(s) / ED Diagnoses   Final diagnoses:  Chest pain    New Prescriptions New Prescriptions   No medications on file     Lorre Nick, MD 01/30/17 1548

## 2017-01-30 NOTE — H&P (Signed)
History and Physical    Justin Jones ZOX:[id number] DOB: 08/17/1966 DOA: 01/30/2017  PCP: Justin Jones   Patient coming from: Home  Chief Complaint: chest pain  HPI: Justin Jones is a 51 y.o. male with medical history significant of GERD and no other medical history who presented to the ED with c/o chest pain that has been going on for few days. Initially he experienced the feeling of tightness and pressure in the chest that was accompanying by numbness in both arms, back of the neck spreading toward to the top of the head. During the last couple of days he developed left sided chest pain with similar arm, neck and head numbness. He denied SOB, nausea, diaphoresis, indigestion, abdominal pain.  ED Course:On presentation to the ED patient had stable VS stable VS with BP fluctuating between 120/78 to 150/77 mmHg. His blood work was unremarkable revealing, no anemia, normal electrolytes and renal function Troponin was normal - 0.00, chest Xray was normal EKG showed normal sinus rhythm, T wave inversion in lead II and flattening T wave in lead III  Review of Systems: As per HPI otherwise 10 point review of systems negative.   Ambulatory Status: independent  Past Medical History:  Diagnosis Date  . GERD (gastroesophageal reflux disease)     Past Surgical History:  Procedure Laterality Date  . none      Social History   Social History  . Marital status: Married    Spouse name: Justin Jones   . Number of children: 2  . Years of education: N/A   Occupational History  .  [employer]   Social History Main Topics  . Smoking status: Never Smoker  . Smokeless tobacco: Never Used  . Alcohol use Yes     Comment: rare  . Drug use: No  . Sexual activity: Not on file   Other Topics Concern  . Not on file   Social History Narrative  . No narrative on file    No Known Allergies  Family History  Problem Relation Age of Onset  . Cirrhosis Father      drinker  . High blood pressure Mother   . High blood pressure Sister     Prior to Admission medications   Medication Sig Start Date End Date Taking? Authorizing Provider  albuterol (PROVENTIL HFA;VENTOLIN HFA) 108 (90 BASE) MCG/ACT inhaler Inhale 1-2 puffs into the lungs every 6 (six) hours as needed for wheezing or shortness of breath. Patient not taking: Reported on 10/21/2016 01/03/14   Justin Likesavid C Keller, Jones  famotidine (PEPCID) 20 MG tablet One at bedtime Patient not taking: Reported on 10/21/2016 04/08/11 10/21/16  Justin CowdenMichael B Wert, Jones    Physical Exam: Vitals:   01/30/17 1515 01/30/17 1530 01/30/17 1545 01/30/17 1600  BP: 139/81 150/77 137/82 136/95  Pulse: 70 61 63 63  Resp: 18 16 18 13   Temp:      TempSrc:      SpO2: 99% 100% 98% 100%  Weight:      Height:         General: Appears calm and comfortable Eyes: PERRLA, EOMI, normal lids, iris ENT:  grossly normal hearing, lips & tongue, mucous membranes moist and intact Neck: no lymphoadenopathy, masses or thyromegaly Cardiovascular: RRR, no m/r/g. No JVD, carotid bruits. No LE edema.  Respiratory: bilateral no wheezes, rales, rhonchi or cracles. Normal respiratory effort. No accessory muscle use observed Abdomen: soft, non-tender, non-distended, no organomegaly or masses appreciated.  BS present in all quadrants Skin: no rash, ulcers or induration seen on limited exam Musculoskeletal: grossly normal tone BUE/BLE, good ROM, no bony abnormality or joint deformities observed Psychiatric: grossly normal mood and affect, speech fluent and appropriate, alert and oriented x3 Neurologic: CN II-XII grossly intact, moves all extremities in coordinated fashion, sensation intact  Labs on Admission: I have personally reviewed following labs and imaging studies  CBC, BMP  GFR: Estimated Creatinine Clearance: 118.1 mL/min (by C-G formula based on SCr of 0.96 mg/dL).  Creatinine Clearance: Estimated Creatinine Clearance: 118.1 mL/min  (by C-G formula based on SCr of 0.96 mg/dL).  Radiological Exams on Admission: Dg Chest 2 View  Result Date: 01/30/2017 CLINICAL DATA:  Chest pain morphine was EXAM: CHEST  2 VIEW COMPARISON:  10/21/2016 FINDINGS: Lungs are clear.  No pleural effusion or pneumothorax. The heart is normal in size. Visualized osseous structures are within normal limits. IMPRESSION: Normal chest radiographs. Electronically Signed   By: Charline Bills M.D.   On: 01/30/2017 13:09    EKG: Independently reviewed - NS rhythm, T wave inversion in lead II and flattening T wave in lead III  Assessment/Plan Principal Problem:   Chest pain Active Problems:   GERD (gastroesophageal reflux disease)    Chest pain Patient will be admitted to the telemetry unit on r/o ACS protocol Continue to cycle cardiac enzymes and if remain negative, proceed with stress Echo Initiate low dose aspirin BP while in the ED was intermittently elevated, which could be attributed to stress, although patient reported that he was prescribed a medication for short time to improve 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

## 2017-01-30 NOTE — ED Notes (Signed)
Patient transported to X-ray 

## 2017-01-30 NOTE — ED Notes (Signed)
Pt. returned from XR. 

## 2017-01-31 ENCOUNTER — Encounter (HOSPITAL_COMMUNITY): Payer: Self-pay | Admitting: Internal Medicine

## 2017-01-31 ENCOUNTER — Other Ambulatory Visit: Payer: Self-pay | Admitting: Physician Assistant

## 2017-01-31 DIAGNOSIS — R079 Chest pain, unspecified: Secondary | ICD-10-CM | POA: Diagnosis not present

## 2017-01-31 DIAGNOSIS — K219 Gastro-esophageal reflux disease without esophagitis: Secondary | ICD-10-CM

## 2017-01-31 DIAGNOSIS — M792 Neuralgia and neuritis, unspecified: Secondary | ICD-10-CM

## 2017-01-31 DIAGNOSIS — M940 Chondrocostal junction syndrome [Tietze]: Secondary | ICD-10-CM | POA: Diagnosis not present

## 2017-01-31 LAB — HIV ANTIBODY (ROUTINE TESTING W REFLEX): HIV Screen 4th Generation wRfx: NONREACTIVE

## 2017-01-31 MED ORDER — FAMOTIDINE 20 MG PO TABS: ORAL_TABLET | ORAL | 0 refills | Status: AC

## 2017-01-31 MED ORDER — IBUPROFEN 800 MG PO TABS: 800.0000 mg | ORAL_TABLET | Freq: Two times a day (BID) | ORAL | 0 refills | Status: DC

## 2017-01-31 NOTE — Consult Note (Signed)
CONSULTATION NOTE  Reason for Consult: Chest pain  Requesting Physician: Dr. Eliseo Squires  Cardiologist: None (NEW)  HPI: This is a 51 y.o. male with a past medical history significant for GERD and obesity, who presents with intermittent chest pain for the past several days. The pain is SS and sharp, intermittend and radiates to the arms, back of the neck and top of the head. Mild hypertension was noted on admission. Today he has a very reproducible pain along the left sternal border in the costal cartilage area. Troponin negative x 3 overnight. EKG shows NSR without ischemic changes. He reports his symptoms have resolved. Denies shortness of breath. No first degree relatives with CAD.  PMHx:  Past Medical History:  Diagnosis Date  . GERD (gastroesophageal reflux disease)    Past Surgical History:  Procedure Laterality Date  . none      FAMHx: Family History  Problem Relation Age of Onset  . Cirrhosis Father     drinker  . Cancer Father   . High blood pressure Mother   . Cancer Mother   . High blood pressure Sister     SOCHx:  reports that he has never smoked. He has never used smokeless tobacco. He reports that he drinks alcohol. He reports that he does not use drugs.  ALLERGIES: No Known Allergies  ROS: Pertinent items noted in HPI and remainder of comprehensive ROS otherwise negative.  HOME MEDICATIONS: No current facility-administered medications on file prior to encounter.    Current Outpatient Prescriptions on File Prior to Encounter  Medication Sig Dispense Refill  . famotidine (PEPCID) 20 MG tablet One at bedtime (Patient not taking: Reported on 10/21/2016)      HOSPITAL MEDICATIONS: Prior to Admission:  Prescriptions Prior to Admission  Medication Sig Dispense Refill Last Dose  . famotidine (PEPCID) 20 MG tablet One at bedtime (Patient not taking: Reported on 10/21/2016)   Not Taking at Unknown time    VITALS: Blood pressure 138/78, pulse 62,  temperature 98.5 F (36.9 C), temperature source Oral, resp. rate 16, height 6' (1.829 m), weight 244 lb 3.2 oz (110.8 kg), SpO2 100 %.  PHYSICAL EXAM: General appearance: alert and no distress Neck: no carotid bruit and no JVD Lungs: clear to auscultation bilaterally Heart: regular rate and rhythm, S1, S2 normal, no murmur, click, rub or gallop and pain on palpation which is pinpoint over the left costal cartilage Abdomen: soft, non-tender; bowel sounds normal; no masses,  no organomegaly and no TTP Extremities: extremities normal, atraumatic, no cyanosis or edema Pulses: 2+ and symmetric Skin: Skin color, texture, turgor normal. No rashes or lesions Neurologic: Grossly normal Psych: Pleasant  LABS: Results for orders placed or performed during the hospital encounter of 01/30/17 (from the past 48 hour(s))  Basic metabolic panel     Status: None   Collection Time: 01/30/17 12:07 PM  Result Value Ref Range   Sodium 140 135 - 145 mmol/L   Potassium 4.2 3.5 - 5.1 mmol/L   Chloride 108 101 - 111 mmol/L   CO2 26 22 - 32 mmol/L   Glucose, Bld 94 65 - 99 mg/dL   BUN 7 6 - 20 mg/dL   Creatinine, Ser 0.96 0.61 - 1.24 mg/dL   Calcium 9.5 8.9 - 10.3 mg/dL   GFR calc non Af Amer >60 >60 mL/min   GFR calc Af Amer >60 >60 mL/min    Comment: (NOTE) The eGFR has been calculated using the CKD EPI equation. This calculation has  not been validated in all clinical situations. eGFR's persistently <60 mL/min signify possible Chronic Kidney Disease.    Anion gap 6 5 - 15  CBC     Status: None   Collection Time: 01/30/17 12:07 PM  Result Value Ref Range   WBC 7.2 4.0 - 10.5 K/uL   RBC 5.54 4.22 - 5.81 MIL/uL   Hemoglobin 14.4 13.0 - 17.0 g/dL   HCT 44.5 39.0 - 52.0 %   MCV 80.3 78.0 - 100.0 fL   MCH 26.0 26.0 - 34.0 pg   MCHC 32.4 30.0 - 36.0 g/dL   RDW 14.8 11.5 - 15.5 %   Platelets 210 150 - 400 K/uL  I-stat troponin, ED     Status: None   Collection Time: 01/30/17 12:36 PM  Result Value  Ref Range   Troponin i, poc 0.00 0.00 - 0.08 ng/mL   Comment 3            Comment: Due to the release kinetics of cTnI, a negative result within the first hours of the onset of symptoms does not rule out myocardial infarction with certainty. If myocardial infarction is still suspected, repeat the test at appropriate intervals.   Troponin I-serum (0, 3, 6 hours)     Status: None   Collection Time: 01/30/17  4:14 PM  Result Value Ref Range   Troponin I <0.03 <0.03 ng/mL  CBC     Status: Abnormal   Collection Time: 01/30/17  4:14 PM  Result Value Ref Range   WBC 8.7 4.0 - 10.5 K/uL   RBC 5.51 4.22 - 5.81 MIL/uL   Hemoglobin 14.2 13.0 - 17.0 g/dL   HCT 43.5 39.0 - 52.0 %   MCV 78.9 78.0 - 100.0 fL   MCH 25.8 (L) 26.0 - 34.0 pg   MCHC 32.6 30.0 - 36.0 g/dL   RDW 14.9 11.5 - 15.5 %   Platelets 243 150 - 400 K/uL  Creatinine, serum     Status: None   Collection Time: 01/30/17  4:14 PM  Result Value Ref Range   Creatinine, Ser 0.88 0.61 - 1.24 mg/dL   GFR calc non Af Amer >60 >60 mL/min   GFR calc Af Amer >60 >60 mL/min    Comment: (NOTE) The eGFR has been calculated using the CKD EPI equation. This calculation has not been validated in all clinical situations. eGFR's persistently <60 mL/min signify possible Chronic Kidney Disease.   Troponin I-serum (0, 3, 6 hours)     Status: None   Collection Time: 01/30/17  6:21 PM  Result Value Ref Range   Troponin I <0.03 <0.03 ng/mL  Troponin I-serum (0, 3, 6 hours)     Status: None   Collection Time: 01/30/17  9:29 PM  Result Value Ref Range   Troponin I <0.03 <0.03 ng/mL    IMAGING: Dg Chest 2 View  Result Date: 01/30/2017 CLINICAL DATA:  Chest pain morphine was EXAM: CHEST  2 VIEW COMPARISON:  10/21/2016 FINDINGS: Lungs are clear.  No pleural effusion or pneumothorax. The heart is normal in size. Visualized osseous structures are within normal limits. IMPRESSION: Normal chest radiographs. Electronically Signed   By: Julian Hy M.D.   On: 01/30/2017 13:09    HOSPITAL DIAGNOSES: Principal Problem:   Chest pain Active Problems:   GERD (gastroesophageal reflux disease)   IMPRESSION: 1. Costochondritis 2. Neuropathic pain  RECOMMENDATION: 1. I suspect his symtpoms may be d/t costochondritis. Pain is pinpoint and reproducible. May also be neuropathic  chest wall pain, ?viral mediated or inflammatory mechanism. No trauma or heaving lifting noted recently, nor any URI symtoms. Would recommend trial of ibuprofen 800 mg BID x 2 weeks. Will arrange for outpatient treadmill stress testing in our office. Ok to d/c home today from my perspective. Follow-up with me afterwards in the [facility] office.  Time Spent Directly with Patient: 15 minutes  Pixie Casino, MD, [facility] Attending Cardiologist [place] 01/31/2017, 10:09 AM

## 2017-01-31 NOTE — Discharge Instructions (Signed)
Heart-Healthy Eating Plan °Heart-healthy meal planning includes: °· Limiting unhealthy fats. °· Increasing healthy fats. °· Making other small dietary changes. °You may need to talk with your doctor or a diet specialist (dietitian) to create an eating plan that is right for you. °What types of fat should I choose? °· Choose healthy fats. These include olive oil and canola oil, flaxseeds, walnuts, almonds, and seeds. °· Eat more omega-3 fats. These include salmon, mackerel, sardines, tuna, flaxseed oil, and ground flaxseeds. Try to eat fish at least twice each week. °· Limit saturated fats. °¨ Saturated fats are often found in animal products, such as meats, butter, and cream. °¨ Plant sources of saturated fats include palm oil, palm kernel oil, and coconut oil. °· Avoid foods with partially hydrogenated oils in them. These include stick margarine, some tub margarines, cookies, crackers, and other baked goods. These contain trans fats. °What general guidelines do I need to follow? °· Check food labels carefully. Identify foods with trans fats or high amounts of saturated fat. °· Fill one half of your plate with vegetables and green salads. Eat 4-5 servings of vegetables per day. A serving of vegetables is: °¨ 1 cup of raw leafy vegetables. °¨ ½ cup of raw or cooked cut-up vegetables. °¨ ½ cup of vegetable juice. °· Fill one fourth of your plate with whole grains. Look for the word "whole" as the first word in the ingredient list. °· Fill one fourth of your plate with lean protein foods. °· Eat 4-5 servings of fruit per day. A serving of fruit is: °¨ One medium whole fruit. °¨ ¼ cup of dried fruit. °¨ ½ cup of fresh, frozen, or canned fruit. °¨ ½ cup of 100% fruit juice. °· Eat more foods that contain soluble fiber. These include apples, broccoli, carrots, beans, peas, and barley. Try to get 20-30 g of fiber per day. °· Eat more home-cooked food. Eat less restaurant, buffet, and fast food. °· Limit or avoid  alcohol. °· Limit foods high in starch and sugar. °· Avoid fried foods. °· Avoid frying your food. Try baking, boiling, grilling, or broiling it instead. You can also reduce fat by: °¨ Removing the skin from poultry. °¨ Removing all visible fats from meats. °¨ Skimming the fat off of stews, soups, and gravies before serving them. °¨ Steaming vegetables in water or broth. °· Lose weight if you are overweight. °· Eat 4-5 servings of nuts, legumes, and seeds per week: °¨ One serving of dried beans or legumes equals ½ cup after being cooked. °¨ One serving of nuts equals 1½ ounces. °¨ One serving of seeds equals ½ ounce or one tablespoon. °· You may need to keep track of how much salt or sodium you eat. This is especially true if you have high blood pressure. Talk with your doctor or dietitian to get more information. °What foods can I eat? °Grains  °Breads, including French, white, pita, wheat, raisin, rye, oatmeal, and Italian. Tortillas that are neither fried nor made with lard or trans fat. Low-fat rolls, including hotdog and hamburger buns and English muffins. Biscuits. Muffins. Waffles. Pancakes. Light popcorn. Whole-grain cereals. Flatbread. Melba toast. Pretzels. Breadsticks. Rusks. Low-fat snacks. Low-fat crackers, including oyster, saltine, matzo, graham, animal, and rye. Rice and pasta, including brown rice and pastas that are made with whole wheat. °Vegetables  °All vegetables. °Fruits  °All fruits, but limit coconut. °Meats and Other Protein Sources  °Lean, well-trimmed beef, veal, pork, and lamb. Chicken and turkey without skin. All   fish and shellfish. Wild duck, rabbit, pheasant, and venison. Egg whites or low-cholesterol egg substitutes. Dried beans, peas, lentils, and tofu. Seeds and most nuts. °Dairy  °Low-fat or nonfat cheeses, including ricotta, string, and mozzarella. Skim or 1% milk that is liquid, powdered, or evaporated. Buttermilk that is made with low-fat milk. Nonfat or low-fat  yogurt. °Beverages  °Mineral water. Diet carbonated beverages. °Sweets and Desserts  °Sherbets and fruit ices. Honey, jam, marmalade, jelly, and syrups. Meringues and gelatins. Pure sugar candy, such as hard candy, jelly beans, gumdrops, mints, marshmallows, and small amounts of dark chocolate. Angel food cake. °Eat all sweets and desserts in moderation. °Fats and Oils  °Nonhydrogenated (trans-free) margarines. Vegetable oils, including soybean, sesame, sunflower, olive, peanut, safflower, corn, canola, and cottonseed. Salad dressings or mayonnaise made with a vegetable oil. Limit added fats and oils that you use for cooking, baking, salads, and as spreads. °Other  °Cocoa powder. Coffee and tea. All seasonings and condiments. °The items listed above may not be a complete list of recommended foods or beverages. Contact your dietitian for more options.  °What foods are not recommended? °Grains  °Breads that are made with saturated or trans fats, oils, or whole milk. Croissants. Butter rolls. Cheese breads. Sweet rolls. Donuts. Buttered popcorn. Chow mein noodles. High-fat crackers, such as cheese or butter crackers. °Meats and Other Protein Sources  °Fatty meats, such as hotdogs, short ribs, sausage, spareribs, bacon, rib eye roast or steak, and mutton. High-fat deli meats, such as salami and bologna. Caviar. Domestic duck and goose. Organ meats, such as kidney, liver, sweetbreads, and heart. °Dairy  °Cream, sour cream, cream cheese, and creamed cottage cheese. Whole-milk cheeses, including blue (bleu), Monterey Jack, Brie, Colby, American, Havarti, Swiss, cheddar, Camembert, and [place]. Whole or 2% milk that is liquid, evaporated, or condensed. Whole buttermilk. Cream sauce or high-fat cheese sauce. Yogurt that is made from whole milk. °Beverages  °Regular sodas and juice drinks with added sugar. °Sweets and Desserts  °Frosting. Pudding. Cookies. Cakes other than angel food cake. Candy that has milk chocolate or  white chocolate, hydrogenated fat, butter, coconut, or unknown ingredients. Buttered syrups. Full-fat ice cream or ice cream drinks. °Fats and Oils  °Gravy that has suet, meat fat, or shortening. Cocoa butter, hydrogenated oils, palm oil, coconut oil, palm kernel oil. These can often be found in baked products, candy, fried foods, nondairy creamers, and whipped toppings. Solid fats and shortenings, including bacon fat, salt pork, lard, and butter. Nondairy cream substitutes, such as coffee creamers and sour cream substitutes. Salad dressings that are made of unknown oils, cheese, or sour cream. °The items listed above may not be a complete list of foods and beverages to avoid. Contact your dietitian for more information.  °This information is not intended to replace advice given to you by your health care provider. Make sure you discuss any questions you have with your health care provider. °Document Released: 05/09/2012 Document Revised: 04/15/2016 Document Reviewed: 05/02/2014 °Elsevier Interactive Patient Education © 2017 [employer]vier Inc. ° °

## 2017-01-31 NOTE — Progress Notes (Signed)
I called schedulers at our office, they were unable to get the ETT scheduler on the phone to schedule at this time - per our discussion I have sent a staff message to the pool to help arrange ETT and f/u appointment with Dr. Rennis GoldenHilty. I requested office to call the patient with instructions and appt. I also wrote this FYI on the patient's AVS.  Markea Ruzich PA-C

## 2017-01-31 NOTE — Discharge Summary (Signed)
Physician Discharge Summary  Justin Jones ZOX:[id number] DOB: May 11, 1966 DOA: 01/30/2017  PCP: Johny Blamer, MD  Admit date: 01/30/2017 Discharge date: 01/31/2017   Recommendations for Outpatient Follow-Up:   1. Outpatient stress test 2. Trial of ibuprofen 800 mg x 2 weeks for possible costocondritis   Discharge Diagnosis:   Principal Problem:   Chest pain Active Problems:   GERD (gastroesophageal reflux disease)   Discharge disposition:  Home.   Discharge Condition: Improved.  Diet recommendation: Low sodium, heart healthy  Wound care: None.   History of Present Illness:   Justin Jones is a 51 y.o. male with a Past Medical History of gerd who presents with worsening left sided chest tightness/pressure while watching tv today. States sx started [employer].  No family hx of cad, does not smoke.  obs tele for chest pain wkup. gerd?   Hospital Course by Problem:   Chest pain -seen by cards- suspect costochondritis -will do trial of ibuprofen and pepcid x 2 weeks -outpatient stress test  GERD -pepcid resumed   Medical Consultants:    cards   Discharge Exam:   Vitals:   01/31/17 0037 01/31/17 0616  BP: 136/73 138/78  Pulse: 87 62  Resp: 16   Temp: 98.7 F (37.1 C) 98.5 F (36.9 C)   Vitals:   01/30/17 1726 01/30/17 1935 01/31/17 0037 01/31/17 0616  BP: (!) 148/84 136/87 136/73 138/78  Pulse: 64 (!) 104 87 62  Resp: 14  16   Temp: 98 F (36.7 C) 98.6 F (37 C) 98.7 F (37.1 C) 98.5 F (36.9 C)  TempSrc: Oral Oral Oral Oral  SpO2: 100% 94% 99% 100%  Weight: 111.6 kg (246 lb)   110.8 kg (244 lb 3.2 oz)  Height: 6' (1.829 m)       Gen:  NAD    The results of significant diagnostics from this hospitalization (including imaging, microbiology, ancillary and laboratory) are listed below for reference.     Procedures and Diagnostic Studies:   Dg Chest 2 View  Result Date: 01/30/2017 CLINICAL DATA:  Chest pain morphine was EXAM: CHEST   2 VIEW COMPARISON:  10/21/2016 FINDINGS: Lungs are clear.  No pleural effusion or pneumothorax. The heart is normal in size. Visualized osseous structures are within normal limits. IMPRESSION: Normal chest radiographs. Electronically Signed   By: Charline Bills M.D.   On: 01/30/2017 13:09     Labs:   Basic Metabolic Panel:  Recent Labs Lab 01/30/17 1207 01/30/17 1614  NA 140  --   K 4.2  --   CL 108  --   CO2 26  --   GLUCOSE 94  --   BUN 7  --   CREATININE 0.96 0.88  CALCIUM 9.5  --    GFR Estimated Creatinine Clearance: 127.7 mL/min (by C-G formula based on SCr of 0.88 mg/dL). Liver Function Tests: No results for input(s): AST, ALT, ALKPHOS, BILITOT, PROT, ALBUMIN in the last 168 hours. No results for input(s): LIPASE, AMYLASE in the last 168 hours. No results for input(s): AMMONIA in the last 168 hours. Coagulation profile No results for input(s): INR, PROTIME in the last 168 hours.  CBC:  Recent Labs Lab 01/30/17 1207 01/30/17 1614  WBC 7.2 8.7  HGB 14.4 14.2  HCT 44.5 43.5  MCV 80.3 78.9  PLT 210 243   Cardiac Enzymes:  Recent Labs Lab 01/30/17 1614 01/30/17 1821 01/30/17 2129  TROPONINI <0.03 <0.03 <0.03   BNP: Invalid input(s): POCBNP CBG: No  results for input(s): GLUCAP in the last 168 hours. D-Dimer No results for input(s): DDIMER in the last 72 hours. Hgb A1c No results for input(s): HGBA1C in the last 72 hours. Lipid Profile No results for input(s): CHOL, HDL, LDLCALC, TRIG, CHOLHDL, LDLDIRECT in the last 72 hours. Thyroid function studies No results for input(s): TSH, T4TOTAL, T3FREE, THYROIDAB in the last 72 hours.  Invalid input(s): FREET3 Anemia work up No results for input(s): VITAMINB12, FOLATE, FERRITIN, TIBC, IRON, RETICCTPCT in the last 72 hours. Microbiology No results found for this or any previous visit (from the past 240 hour(s)).   Discharge Instructions:   Discharge Instructions    Diet - low sodium heart healthy     Complete by:  As directed    Discharge instructions    Complete by:  As directed    Outpatient stress test   Increase activity slowly    Complete by:  As directed      Allergies as of 01/31/2017   No Known Allergies     Medication List    TAKE these medications   famotidine 20 MG tablet Commonly known as:  PEPCID One at bedtime   ibuprofen 800 MG tablet Commonly known as:  ADVIL,MOTRIN Take 1 tablet (800 mg total) by mouth 2 (two) times daily.      Follow-up Information    Chrystie NoseKenneth C Hilty, MD Follow up.   Specialty:  Cardiology Why:  Dr. Blanchie DessertHilty's office will call you to schedule your stress test and follow-up appointment. Pleae call the office if you have not heard back within the next 24 hours. Contact information: [address] [place] [address] [phone]            Time coordinating discharge: 35 min  Signed:  JESSICA U VANN   [facility] 01/31/2017, 10:52 AM

## 2017-02-01 ENCOUNTER — Ambulatory Visit (INDEPENDENT_AMBULATORY_CARE_PROVIDER_SITE_OTHER): Payer: Commercial Managed Care - HMO

## 2017-02-01 DIAGNOSIS — R079 Chest pain, unspecified: Secondary | ICD-10-CM | POA: Diagnosis not present

## 2017-02-01 LAB — EXERCISE TOLERANCE TEST
CHL CUP MPHR: 169 {beats}/min
CHL CUP RESTING HR STRESS: 82 {beats}/min
CHL CUP STRESS STAGE 1 DBP: 91 mmHg
CHL CUP STRESS STAGE 2 SPEED: 0 mph
CHL CUP STRESS STAGE 3 GRADE: 0 %
CHL CUP STRESS STAGE 3 SPEED: 1 mph
CHL CUP STRESS STAGE 4 GRADE: 0.1 %
CHL CUP STRESS STAGE 5 SPEED: 1.7 mph
CHL CUP STRESS STAGE 6 SPEED: 2.5 mph
CHL CUP STRESS STAGE 7 GRADE: 14 %
CHL CUP STRESS STAGE 7 SPEED: 3.4 mph
CHL CUP STRESS STAGE 8 DBP: 77 mmHg
CHL CUP STRESS STAGE 8 GRADE: 0 %
CHL CUP STRESS STAGE 8 HR: 144 {beats}/min
CHL CUP STRESS STAGE 8 SBP: 181 mmHg
CHL CUP STRESS STAGE 9 DBP: 86 mmHg
CHL RATE OF PERCEIVED EXERTION: 17
CSEPED: 8 min
CSEPEDS: 0 s
CSEPEW: 10.1 METS
CSEPHR: 95 %
CSEPPHR: 162 {beats}/min
CSEPPMHR: 95 %
Stage 1 Grade: 0 %
Stage 1 HR: 83 {beats}/min
Stage 1 SBP: 136 mmHg
Stage 1 Speed: 0 mph
Stage 2 Grade: 0 %
Stage 2 HR: 84 {beats}/min
Stage 3 HR: 102 {beats}/min
Stage 4 HR: 103 {beats}/min
Stage 4 Speed: 1 mph
Stage 5 DBP: 84 mmHg
Stage 5 Grade: 10 %
Stage 5 HR: 123 {beats}/min
Stage 5 SBP: 147 mmHg
Stage 6 DBP: 77 mmHg
Stage 6 Grade: 12 %
Stage 6 HR: 141 {beats}/min
Stage 6 SBP: 189 mmHg
Stage 7 HR: 162 {beats}/min
Stage 8 Speed: 0 mph
Stage 9 Grade: 0 %
Stage 9 HR: 106 {beats}/min
Stage 9 SBP: 129 mmHg
Stage 9 Speed: 0 mph

## 2017-02-07 DIAGNOSIS — M940 Chondrocostal junction syndrome [Tietze]: Secondary | ICD-10-CM | POA: Diagnosis not present

## 2017-02-14 ENCOUNTER — Ambulatory Visit (INDEPENDENT_AMBULATORY_CARE_PROVIDER_SITE_OTHER): Payer: Commercial Managed Care - HMO | Admitting: Internal Medicine

## 2017-02-14 ENCOUNTER — Encounter: Payer: Self-pay | Admitting: Internal Medicine

## 2017-02-14 VITALS — BP 136/80 | HR 81 | Ht 72.0 in | Wt 250.0 lb

## 2017-02-14 DIAGNOSIS — M940 Chondrocostal junction syndrome [Tietze]: Secondary | ICD-10-CM

## 2017-02-14 NOTE — Patient Instructions (Signed)
Your physician recommends that you schedule a follow-up appointment as needed  

## 2017-02-14 NOTE — Progress Notes (Signed)
    OFFICE NOTE  Chief Complaint:  Hospital follow-up  Primary Care Physician: Johny BlamerHARRIS, WILLIAM, MD  HPI:  Justin Jones is a 51 y.o. male with a past medical history significant for GERD and obesity, who [facility] early March [facility] with intermittent chest pain for the past several days. The pain is SS and sharp, intermittend and radiates to the arms, back of the neck and top of the head. Mild hypertension was noted on admission. Today he has a very reproducible pain along the left sternal border in the costal cartilage area. Troponin negative x 3 overnight. EKG shows NSR without ischemic changes. He reports his symptoms have resolved. Denies shortness of breath. No first degree relatives with CAD. I had recommended a trial of ibuprofen as his symptoms were more consistent with costochondritis. He took 800 mg twice daily for the past 2 weeks and has 2 more pills to finish. He says he feels much better and his symptoms should not returned at all.  PMHx:  Past Medical History:  Diagnosis Date  . GERD (gastroesophageal reflux disease)     Past Surgical History:  Procedure Laterality Date  . none      FAMHx:  Family History  Problem Relation Age of Onset  . Cirrhosis Father     drinker  . Cancer Father   . High blood pressure Mother   . Cancer Mother   . High blood pressure Sister     SOCHx:   reports that he has never smoked. He has never used smokeless tobacco. He reports that he drinks alcohol. He reports that he does not use drugs.  ALLERGIES:  No Known Allergies  ROS: Pertinent items noted in HPI and remainder of comprehensive ROS otherwise negative.  HOME MEDS: Current Outpatient Prescriptions on File Prior to Visit  Medication Sig Dispense Refill  . famotidine (PEPCID) 20 MG tablet One at bedtime 30 tablet 0   No current facility-administered medications on file prior to visit.     LABS/IMAGING: No results found for this or any previous  visit (from the past 48 hour(s)). No results found.  WEIGHTS: Wt Readings from Last 3 Encounters:  02/14/17 250 lb (113.4 kg)  01/31/17 244 lb 3.2 oz (110.8 kg)  04/29/14 250 lb 12.8 oz (113.8 kg)    VITALS: BP 136/80   Pulse 81   Ht 6' (1.829 m)   Wt 250 lb (113.4 kg)   SpO2 93%   BMI 33.91 kg/m   EXAM: General appearance: alert and no distress Lungs: clear to auscultation bilaterally Heart: regular rate and rhythm, S1, S2 normal, no murmur, click, rub or gallop Extremities: extremities normal, atraumatic, no cyanosis or edema Neurologic: Grossly normal  EKG: Deferred  ASSESSMENT: 1. Atypical chest pain-likely costochondritis, resolved  PLAN: 1.   Justin Jones had atypical chest pain and ruled out for MI. He is responded to course of anti-inflammatories and I suspect that costochondritis. Symptoms have now resolved. I believe he could discontinue his ibuprofen for the time being. He has had some borderline elevated blood pressures and this should be monitored closely. He is not currently on blood pressure medication. Would benefit from some weight loss as well. Follow-up with on an as needed basis.  Chrystie NoseKenneth C. Hilty, MD, [facility] Attending Cardiologist [facility]  Lisette AbuKenneth C Hilty 02/14/2017, 1:03 PM

## 2017-02-28 DIAGNOSIS — E7800 Pure hypercholesterolemia, unspecified: Secondary | ICD-10-CM | POA: Diagnosis not present

## 2017-02-28 DIAGNOSIS — Z125 Encounter for screening for malignant neoplasm of prostate: Secondary | ICD-10-CM | POA: Diagnosis not present

## 2017-02-28 DIAGNOSIS — Z0000 Encounter for general adult medical examination without abnormal findings: Secondary | ICD-10-CM | POA: Diagnosis not present

## 2017-03-01 ENCOUNTER — Other Ambulatory Visit: Payer: Self-pay | Admitting: Family Medicine

## 2017-03-01 ENCOUNTER — Ambulatory Visit
Admission: RE | Admit: 2017-03-01 | Discharge: 2017-03-01 | Disposition: A | Discharge status: Home / Self Care | Payer: Commercial Managed Care - HMO | Source: Ambulatory Visit | Attending: Family Medicine | Admitting: Family Medicine

## 2017-03-01 DIAGNOSIS — M25531 Pain in right wrist: Secondary | ICD-10-CM

## 2017-03-01 DIAGNOSIS — M7989 Other specified soft tissue disorders: Secondary | ICD-10-CM | POA: Diagnosis not present

## 2017-03-15 DIAGNOSIS — Z125 Encounter for screening for malignant neoplasm of prostate: Secondary | ICD-10-CM | POA: Diagnosis not present

## 2017-03-15 DIAGNOSIS — E7800 Pure hypercholesterolemia, unspecified: Secondary | ICD-10-CM | POA: Diagnosis not present

## 2018-02-03 DIAGNOSIS — M25512 Pain in left shoulder: Secondary | ICD-10-CM | POA: Diagnosis not present

## 2018-02-03 DIAGNOSIS — M170 Bilateral primary osteoarthritis of knee: Secondary | ICD-10-CM | POA: Diagnosis not present

## 2018-02-09 DIAGNOSIS — M25562 Pain in left knee: Secondary | ICD-10-CM | POA: Diagnosis not present

## 2018-02-09 DIAGNOSIS — M25512 Pain in left shoulder: Secondary | ICD-10-CM | POA: Diagnosis not present

## 2018-02-09 DIAGNOSIS — M25511 Pain in right shoulder: Secondary | ICD-10-CM | POA: Diagnosis not present

## 2018-02-09 DIAGNOSIS — M25561 Pain in right knee: Secondary | ICD-10-CM | POA: Diagnosis not present

## 2018-03-02 DIAGNOSIS — E7800 Pure hypercholesterolemia, unspecified: Secondary | ICD-10-CM | POA: Diagnosis not present

## 2018-03-02 DIAGNOSIS — Z0000 Encounter for general adult medical examination without abnormal findings: Secondary | ICD-10-CM | POA: Diagnosis not present

## 2018-03-02 DIAGNOSIS — Z125 Encounter for screening for malignant neoplasm of prostate: Secondary | ICD-10-CM | POA: Diagnosis not present

## 2018-10-24 DIAGNOSIS — H43393 Other vitreous opacities, bilateral: Secondary | ICD-10-CM | POA: Diagnosis not present

## 2018-10-24 DIAGNOSIS — H40033 Anatomical narrow angle, bilateral: Secondary | ICD-10-CM | POA: Diagnosis not present

## 2018-12-20 DIAGNOSIS — M25512 Pain in left shoulder: Secondary | ICD-10-CM | POA: Diagnosis not present

## 2018-12-20 DIAGNOSIS — M79671 Pain in right foot: Secondary | ICD-10-CM | POA: Diagnosis not present

## 2018-12-20 DIAGNOSIS — S40012A Contusion of left shoulder, initial encounter: Secondary | ICD-10-CM | POA: Diagnosis not present

## 2019-01-17 DIAGNOSIS — M25572 Pain in left ankle and joints of left foot: Secondary | ICD-10-CM | POA: Diagnosis not present

## 2019-01-17 DIAGNOSIS — M25571 Pain in right ankle and joints of right foot: Secondary | ICD-10-CM | POA: Diagnosis not present

## 2019-03-05 DIAGNOSIS — M25572 Pain in left ankle and joints of left foot: Secondary | ICD-10-CM | POA: Diagnosis not present

## 2019-03-05 DIAGNOSIS — M25571 Pain in right ankle and joints of right foot: Secondary | ICD-10-CM | POA: Diagnosis not present

## 2019-03-23 DIAGNOSIS — R05 Cough: Secondary | ICD-10-CM | POA: Diagnosis not present

## 2019-03-23 DIAGNOSIS — J302 Other seasonal allergic rhinitis: Secondary | ICD-10-CM | POA: Diagnosis not present

## 2019-04-09 DIAGNOSIS — M25571 Pain in right ankle and joints of right foot: Secondary | ICD-10-CM | POA: Diagnosis not present

## 2019-04-09 DIAGNOSIS — M25572 Pain in left ankle and joints of left foot: Secondary | ICD-10-CM | POA: Diagnosis not present

## 2020-07-21 ENCOUNTER — Other Ambulatory Visit: Payer: Self-pay

## 2020-07-21 ENCOUNTER — Emergency Department (HOSPITAL_COMMUNITY): Payer: 59

## 2020-07-21 ENCOUNTER — Emergency Department (HOSPITAL_COMMUNITY)
Admission: EM | Admit: 2020-07-21 | Discharge: 2020-07-21 | Disposition: A | Discharge status: Home / Self Care | Payer: 59 | Attending: Emergency Medicine | Admitting: Emergency Medicine

## 2020-07-21 DIAGNOSIS — M25551 Pain in right hip: Secondary | ICD-10-CM | POA: Diagnosis not present

## 2020-07-21 DIAGNOSIS — R269 Unspecified abnormalities of gait and mobility: Secondary | ICD-10-CM | POA: Diagnosis not present

## 2020-07-21 DIAGNOSIS — W19XXXA Unspecified fall, initial encounter: Secondary | ICD-10-CM | POA: Insufficient documentation

## 2020-07-21 DIAGNOSIS — M25511 Pain in right shoulder: Secondary | ICD-10-CM | POA: Diagnosis present

## 2020-07-21 DIAGNOSIS — Y9389 Activity, other specified: Secondary | ICD-10-CM | POA: Insufficient documentation

## 2020-07-21 DIAGNOSIS — M26629 Arthralgia of temporomandibular joint, unspecified side: Secondary | ICD-10-CM | POA: Diagnosis not present

## 2020-07-21 DIAGNOSIS — Z79899 Other long term (current) drug therapy: Secondary | ICD-10-CM | POA: Diagnosis not present

## 2020-07-21 DIAGNOSIS — Y990 Civilian activity done for income or pay: Secondary | ICD-10-CM | POA: Insufficient documentation

## 2020-07-21 DIAGNOSIS — Y929 Unspecified place or not applicable: Secondary | ICD-10-CM | POA: Insufficient documentation

## 2020-07-21 DIAGNOSIS — M791 Myalgia, unspecified site: Secondary | ICD-10-CM | POA: Insufficient documentation

## 2020-07-21 MED ORDER — IBUPROFEN 400 MG PO TABS
600.0000 mg | ORAL_TABLET | Freq: Once | ORAL | Status: AC
  Administered 2020-07-21: 600 mg via ORAL
  Filled 2020-07-21: qty 1

## 2020-07-21 MED ORDER — ACETAMINOPHEN 500 MG PO TABS
1000.0000 mg | ORAL_TABLET | Freq: Once | ORAL | Status: AC
  Administered 2020-07-21: 1000 mg via ORAL
  Filled 2020-07-21: qty 2

## 2020-07-21 NOTE — ED Provider Notes (Signed)
[facility] EMERGENCY DEPARTMENT Provider Note   CSN: [id number] Arrival date & time: 07/21/20  3354     History No chief complaint on file.   Justin Jones is a 54 y.o. male presents to the ED for evaluation of fall while at work.  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  States he ran away but fell.  He landed mostly on the right side of his body on his right shoulder and right hip.  Reports immediate pain to a milder degree but states ever since coming to the ED the pain is worsened.  He states the pain is worse with movement, palpation.  Has been able to walk with significant pain in the right hip.  Denies any other injury including head pain, head injury, loss of consciousness.  No other injuries reported.  No other associate symptoms.  No interventions   HPI     Past Medical History:  Diagnosis Date  . GERD (gastroesophageal reflux disease)     Patient Active Problem List   Diagnosis Date Noted  . Costochondritis 01/30/2017  . GERD (gastroesophageal reflux disease) 01/30/2017  . Cough 04/08/2011    Past Surgical History:  Procedure Laterality Date  . none         Family History  Problem Relation Age of Onset  . Cirrhosis Father        drinker  . Cancer Father   . High blood pressure Mother   . Cancer Mother   . High blood pressure Sister     Social History   Tobacco Use  . Smoking status: Never Smoker  . Smokeless tobacco: Never Used  Substance Use Topics  . Alcohol use: Yes    Comment: rare  . Drug use: No    Home Medications Prior to Admission medications   Medication Sig Start Date End Date Taking? Authorizing Provider  famotidine (PEPCID) 20 MG tablet One at bedtime 01/31/17 08/16/22  Joseph Art, DO    Allergies    Patient has no known allergies.  Review of Systems   Review of Systems  Musculoskeletal: Positive for arthralgias, gait problem and myalgias.  All other systems  reviewed and are negative.   Physical Exam Updated Vital Signs BP (!) 145/92 (BP Location: Left Arm)   Pulse 67   Temp 98.8 F (37.1 C) (Oral)   Resp 17   SpO2 100%   Physical Exam Vitals and nursing note reviewed.  Constitutional:      General: He is not in acute distress.    Appearance: He is well-developed.     Comments: NAD.  HENT:     Head: Normocephalic and atraumatic.     Right Ear: External ear normal.     Left Ear: External ear normal.     Nose: Nose normal.  Eyes:     General: No scleral icterus.    Conjunctiva/sclera: Conjunctivae normal.  Cardiovascular:     Rate and Rhythm: Normal rate and regular rhythm.     Heart sounds: Normal heart sounds. No murmur heard.      Comments: Distal pulses intact in the right upper and lower extremities Pulmonary:     Effort: Pulmonary effort is normal.     Breath sounds: Normal breath sounds. No wheezing.     Comments: No right-sided chest wall tenderness Musculoskeletal:        General: No deformity. Normal range of motion.     Cervical back: Normal  range of motion and neck supple.     Comments: Tenderness on the lateral right shoulder, no obvious contusion or skin injury over these areas.  No focal bony tenderness over the right clavicle, AC or Mobile joint, scapula.  Decreased shoulder abduction secondary to pain.  Full flexion/extension of the shoulder with some pain noted.  Tenderness in the lateral right hip, no obvious contusion or skin injuries over the this area.  No focal bony tenderness otherwise in the hip.  Full flexion and abduction of the hip with some pain noted.  No pain with logroll.  No leg shortening or rotation.  Skin:    General: Skin is warm and dry.     Capillary Refill: Capillary refill takes less than 2 seconds.  Neurological:     Mental Status: He is alert and oriented to person, place, and time.     Comments: Sensation and strength intact in the right upper and lower extremities.  Psychiatric:         Behavior: Behavior normal.        Thought Content: Thought content normal.        Judgment: Judgment normal.     ED Results / Procedures / Treatments   Labs (all labs ordered are listed, but only abnormal results are displayed) Labs Reviewed - No data to display  EKG None  Radiology DG Shoulder Right  Result Date: 07/21/2020 CLINICAL DATA:  Larey Seat, right shoulder pain, limited range of motion EXAM: RIGHT SHOULDER - 2+ VIEW COMPARISON:  None. FINDINGS: Internal rotation, external rotation, and transscapular views of the right shoulder demonstrate no fracture, subluxation, or dislocation. Hypertrophic changes are seen of the acromioclavicular joint. There is mild glenohumeral joint space narrowing and osteophyte formation compatible with osteoarthritis. Right chest is clear. IMPRESSION: 1. 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 2. No acute displaced fracture. Electronically Signed   By: Sharlet Salina M.D.   On: 07/21/2020 03:28   DG Hip Unilat W or Wo Pelvis 2-3 Views Right  Result Date: 07/21/2020 CLINICAL DATA:  Larey Seat, right lateral hip pain EXAM: DG HIP (WITH OR WITHOUT PELVIS) 2-3V RIGHT COMPARISON:  None. FINDINGS: Frontal view of the pelvis as well as frontal and frogleg lateral views of the right hip are obtained. No acute displaced fractures. Mild symmetrical bilateral hip osteoarthritis. Alignment is anatomic. The remainder of the bony pelvis is normal. IMPRESSION: 1. Mild osteoarthritis.  No acute fracture. Electronically Signed   By: Sharlet Salina M.D.   On: 07/21/2020 03:29    Procedures Procedures (including critical care time)  Medications Ordered in ED Medications  ibuprofen (ADVIL) tablet 600 mg (600 mg Oral Given 07/21/20 0828)  acetaminophen (TYLENOL) tablet 1,000 mg (1,000 mg Oral Given 07/21/20 3009)    ED Course  I have reviewed the triage vital signs and the nursing notes.  Pertinent labs & imaging results that were available  during my care of the patient were reviewed by me and considered in my medical decision making (see chart for details).    MDM Rules/Calculators/A&P                          54 year old male presents for right lateral shoulder and hip pain after a mechanical fall while at work.  Exam exam reveals lateral shoulder and hip tenderness otherwise benign, normal neuro and vascular status.  X-rays obtained in triage personally visualized and interpreted, do not show any acute osseous injury, dislocations.  Suspect soft tissue injury or contusion.  Will discharge with ice, rest, NSAIDs.  Offered shoulder sling but patient declined.  Follow-up with PCP for persistent pain.  Return to the ED for worsening symptoms.  He is comfortable with this plan.   Final Clinical Impression(s) / ED Diagnoses Final diagnoses:  Fall, initial encounter  Work related injury    Rx / DC Orders ED Discharge Orders    None       Jerrell Mylar 07/21/20 0847    Melene Plan, DO 07/21/20 0900

## 2020-07-21 NOTE — ED Triage Notes (Signed)
fell today running from a dog and sore on his right hip and right shoulder. No loc

## 2020-07-21 NOTE — ED Notes (Signed)
Patient Alert and oriented to baseline. Stable and ambulatory to baseline. Patient verbalized understanding of the discharge instructions.  Patient belongings were taken by the patient.   

## 2020-07-21 NOTE — Discharge Instructions (Addendum)
You were seen in the ER after a fall while at work, right elbow and hip pain  X-rays do not show any signs of bone injury  Your pain is likely from a contusion or bruise  Pain should improve after 72 hours of rest, anti-inflammatories, ice  Rest for 24-48 hours, after this start light range of motion exercises  For pain and inflammation you can use a combination of ibuprofen and acetaminophen.  Take [id number] mg acetaminophen (tylenol) every 6 hours or 600 mg ibuprofen (advil, motrin) every 6 hours.  You can take these separately or combine them every 6 hours for maximum pain control. Do not exceed 4,000 mg acetaminophen or 2,400 mg ibuprofen in a 24 hour period.  Do not take ibuprofen containing products if you have history of kidney disease, ulcers, GI bleeding, severe acid reflux, or take a blood thinner.  Do not take acetaminophen if you have liver disease.   Ice every 4-6 hours  Return for worsening or new symptoms

## 2020-08-02 ENCOUNTER — Ambulatory Visit (HOSPITAL_COMMUNITY)
Admission: EM | Admit: 2020-08-02 | Discharge: 2020-08-02 | Disposition: A | Discharge status: Home / Self Care | Payer: 59 | Attending: Emergency Medicine | Admitting: Emergency Medicine

## 2020-08-02 ENCOUNTER — Other Ambulatory Visit: Payer: Self-pay

## 2020-08-02 DIAGNOSIS — Z1152 Encounter for screening for COVID-19: Secondary | ICD-10-CM

## 2020-08-02 DIAGNOSIS — Z20822 Contact with and (suspected) exposure to covid-19: Secondary | ICD-10-CM | POA: Diagnosis present

## 2020-08-02 NOTE — Discharge Instructions (Signed)

## 2020-08-02 NOTE — ED Triage Notes (Signed)
Reports positive covid exposure; denies any sxs.

## 2020-08-03 LAB — SARS CORONAVIRUS 2 (TAT 6-24 HRS): SARS Coronavirus 2: NEGATIVE

## 2022-01-19 ENCOUNTER — Emergency Department (HOSPITAL_COMMUNITY): Payer: No Typology Code available for payment source

## 2022-01-19 ENCOUNTER — Emergency Department (HOSPITAL_COMMUNITY): Payer: Worker's Compensation

## 2022-01-19 ENCOUNTER — Other Ambulatory Visit: Payer: Self-pay

## 2022-01-19 ENCOUNTER — Encounter (HOSPITAL_COMMUNITY): Payer: Self-pay | Admitting: Emergency Medicine

## 2022-01-19 ENCOUNTER — Inpatient Hospital Stay (HOSPITAL_COMMUNITY)
Admission: EM | Admit: 2022-01-19 | Discharge: 2022-01-20 | DRG: 206 | Disposition: A | Discharge status: Home / Self Care | Payer: No Typology Code available for payment source | Attending: Surgery | Admitting: Surgery

## 2022-01-19 DIAGNOSIS — S2241XA Multiple fractures of ribs, right side, initial encounter for closed fracture: Secondary | ICD-10-CM | POA: Diagnosis present

## 2022-01-19 DIAGNOSIS — R0902 Hypoxemia: Secondary | ICD-10-CM | POA: Diagnosis present

## 2022-01-19 DIAGNOSIS — S27321A Contusion of lung, unilateral, initial encounter: Secondary | ICD-10-CM | POA: Diagnosis present

## 2022-01-19 DIAGNOSIS — K219 Gastro-esophageal reflux disease without esophagitis: Secondary | ICD-10-CM | POA: Diagnosis present

## 2022-01-19 DIAGNOSIS — S40811A Abrasion of right upper arm, initial encounter: Secondary | ICD-10-CM | POA: Diagnosis present

## 2022-01-19 DIAGNOSIS — Z20822 Contact with and (suspected) exposure to covid-19: Secondary | ICD-10-CM | POA: Diagnosis present

## 2022-01-19 DIAGNOSIS — D72828 Other elevated white blood cell count: Secondary | ICD-10-CM | POA: Diagnosis present

## 2022-01-19 DIAGNOSIS — Y990 Civilian activity done for income or pay: Secondary | ICD-10-CM | POA: Diagnosis not present

## 2022-01-19 DIAGNOSIS — Z809 Family history of malignant neoplasm, unspecified: Secondary | ICD-10-CM | POA: Diagnosis not present

## 2022-01-19 DIAGNOSIS — S2249XA Multiple fractures of ribs, unspecified side, initial encounter for closed fracture: Secondary | ICD-10-CM | POA: Diagnosis present

## 2022-01-19 DIAGNOSIS — R0789 Other chest pain: Secondary | ICD-10-CM | POA: Diagnosis present

## 2022-01-19 DIAGNOSIS — Y92411 Interstate highway as the place of occurrence of the external cause: Secondary | ICD-10-CM

## 2022-01-19 DIAGNOSIS — T1490XA Injury, unspecified, initial encounter: Secondary | ICD-10-CM

## 2022-01-19 LAB — COMPREHENSIVE METABOLIC PANEL
ALT: 97 U/L — ABNORMAL HIGH (ref 0–44)
AST: 88 U/L — ABNORMAL HIGH (ref 15–41)
Albumin: 4 g/dL (ref 3.5–5.0)
Alkaline Phosphatase: 59 U/L (ref 38–126)
Anion gap: 11 (ref 5–15)
BUN: 9 mg/dL (ref 6–20)
CO2: 22 mmol/L (ref 22–32)
Calcium: 8.9 mg/dL (ref 8.9–10.3)
Chloride: 104 mmol/L (ref 98–111)
Creatinine, Ser: 1.02 mg/dL (ref 0.61–1.24)
GFR, Estimated: >60 mL/min (ref >60)
Glucose, Bld: 173 mg/dL — ABNORMAL HIGH (ref 70–99)
Potassium: 3.3 mmol/L — ABNORMAL LOW (ref 3.5–5.1)
Sodium: 137 mmol/L (ref 135–145)
Total Bilirubin: 0.3 mg/dL (ref 0.3–1.2)
Total Protein: 6.2 g/dL — ABNORMAL LOW (ref 6.5–8.1)

## 2022-01-19 LAB — CBC
HCT: 43.3 % (ref 39.0–52.0)
Hemoglobin: 13.5 g/dL (ref 13.0–17.0)
MCH: 25.9 pg — ABNORMAL LOW (ref 26.0–34.0)
MCHC: 31.2 g/dL (ref 30.0–36.0)
MCV: 83.1 fL (ref 80.0–100.0)
Platelets: 253 10*3/uL (ref 150–400)
RBC: 5.21 MIL/uL (ref 4.22–5.81)
RDW: 14.6 % (ref 11.5–15.5)
WBC: 11.5 10*3/uL — ABNORMAL HIGH (ref 4.0–10.5)
nRBC: 0 % (ref 0.0–0.2)

## 2022-01-19 LAB — URINALYSIS, ROUTINE W REFLEX MICROSCOPIC
Bilirubin Urine: NEGATIVE
Glucose, UA: NEGATIVE mg/dL
Hgb urine dipstick: NEGATIVE
Ketones, ur: NEGATIVE mg/dL
Leukocytes,Ua: NEGATIVE
Nitrite: NEGATIVE
Protein, ur: NEGATIVE mg/dL
Specific Gravity, Urine: >1.046 — ABNORMAL HIGH (ref 1.005–1.030)
pH: 5 (ref 5.0–8.0)

## 2022-01-19 LAB — RESP PANEL BY RT-PCR (FLU A&B, COVID) ARPGX2
Influenza A by PCR: NEGATIVE
Influenza B by PCR: NEGATIVE
SARS Coronavirus 2 by RT PCR: NEGATIVE

## 2022-01-19 LAB — ETHANOL: Alcohol, Ethyl (B): <10 mg/dL (ref <10)

## 2022-01-19 LAB — LACTIC ACID, PLASMA: Lactic Acid, Venous: 2.3 mmol/L (ref 0.5–1.9)

## 2022-01-19 MED ORDER — ONDANSETRON HCL 4 MG/2ML IJ SOLN
4.0000 mg | Freq: Once | INTRAMUSCULAR | Status: DC
  Filled 2022-01-19 (×2): qty 2

## 2022-01-19 MED ORDER — OXYCODONE HCL 5 MG PO TABS
5.0000 mg | ORAL_TABLET | ORAL | Status: DC | PRN
Start: 2022-01-19 — End: 2022-01-20

## 2022-01-19 MED ORDER — METHOCARBAMOL 1000 MG/10ML IJ SOLN
500.0000 mg | Freq: Four times a day (QID) | INTRAMUSCULAR | Status: DC | PRN
  Filled 2022-01-19 (×2): qty 5

## 2022-01-19 MED ORDER — ONDANSETRON HCL 4 MG/2ML IJ SOLN
4.0000 mg | Freq: Four times a day (QID) | INTRAMUSCULAR | Status: DC | PRN
Start: 2022-01-19 — End: 2022-01-20

## 2022-01-19 MED ORDER — SODIUM CHLORIDE 0.9 % IV BOLUS
1000.0000 mL | Freq: Once | INTRAVENOUS | Status: AC
  Administered 2022-01-19: 1000 mL via INTRAVENOUS

## 2022-01-19 MED ORDER — LIDOCAINE 5 % EX PTCH
1.0000 | MEDICATED_PATCH | CUTANEOUS | Status: DC
  Administered 2022-01-20: 1 via TRANSDERMAL
  Filled 2022-01-19: qty 1

## 2022-01-19 MED ORDER — HYDROMORPHONE HCL 1 MG/ML IJ SOLN: 0.5000 mg | INTRAMUSCULAR | Status: DC | PRN

## 2022-01-19 MED ORDER — MORPHINE SULFATE (PF) 4 MG/ML IV SOLN
4.0000 mg | Freq: Once | INTRAVENOUS | Status: AC
  Administered 2022-01-19: 4 mg via INTRAVENOUS
  Filled 2022-01-19: qty 1

## 2022-01-19 MED ORDER — GABAPENTIN 300 MG PO CAPS
300.0000 mg | ORAL_CAPSULE | Freq: Three times a day (TID) | ORAL | Status: DC
Start: 2022-01-20 — End: 2022-01-20
  Administered 2022-01-20 (×2): 300 mg via ORAL
  Filled 2022-01-19 (×2): qty 1

## 2022-01-19 MED ORDER — DOCUSATE SODIUM 100 MG PO CAPS
100.0000 mg | ORAL_CAPSULE | Freq: Two times a day (BID) | ORAL | Status: DC
  Administered 2022-01-20: 100 mg via ORAL
  Filled 2022-01-19: qty 1

## 2022-01-19 MED ORDER — HYDROMORPHONE HCL 1 MG/ML IJ SOLN
1.0000 mg | Freq: Once | INTRAMUSCULAR | Status: AC
  Administered 2022-01-19: 1 mg via INTRAVENOUS
  Filled 2022-01-19: qty 1

## 2022-01-19 MED ORDER — PROCHLORPERAZINE EDISYLATE 10 MG/2ML IJ SOLN: 10.0000 mg | INTRAMUSCULAR | Status: DC | PRN

## 2022-01-19 MED ORDER — IOHEXOL 300 MG/ML  SOLN
100.0000 mL | Freq: Once | INTRAMUSCULAR | Status: AC | PRN
  Administered 2022-01-19: 100 mL via INTRAVENOUS

## 2022-01-19 MED ORDER — SIMETHICONE 80 MG PO CHEW: 80.0000 mg | CHEWABLE_TABLET | Freq: Four times a day (QID) | ORAL | Status: DC | PRN

## 2022-01-19 MED ORDER — ACETAMINOPHEN 325 MG PO TABS
650.0000 mg | ORAL_TABLET | Freq: Four times a day (QID) | ORAL | Status: DC
  Administered 2022-01-20 (×2): 650 mg via ORAL
  Filled 2022-01-19 (×2): qty 2

## 2022-01-19 MED ORDER — OXYCODONE HCL 5 MG PO TABS
10.0000 mg | ORAL_TABLET | ORAL | Status: DC | PRN
  Administered 2022-01-20: 10 mg via ORAL
  Filled 2022-01-19: qty 2

## 2022-01-19 MED ORDER — KETOROLAC TROMETHAMINE 15 MG/ML IJ SOLN
15.0000 mg | Freq: Three times a day (TID) | INTRAMUSCULAR | Status: DC
  Administered 2022-01-20 (×2): 15 mg via INTRAVENOUS
  Filled 2022-01-19 (×2): qty 1

## 2022-01-19 MED ORDER — ENOXAPARIN SODIUM 30 MG/0.3ML IJ SOSY
30.0000 mg | PREFILLED_SYRINGE | Freq: Two times a day (BID) | INTRAMUSCULAR | Status: DC
  Administered 2022-01-20: 30 mg via SUBCUTANEOUS
  Filled 2022-01-19: qty 0.3

## 2022-01-19 NOTE — Discharge Instructions (Addendum)
RIB FRACTURES  HOME INSTRUCTIONS   PAIN CONTROL:  Pain is best controlled by a usual combination of three different methods TOGETHER:  Ice/Heat Over the counter pain medication Prescription pain medication You may experience some swelling and bruising in area of broken ribs. Ice packs or heating pads (30-60 minutes up to 6 times a day) will help. Use ice for the first few days to help decrease swelling and bruising, then switch to heat to help relax tight/sore spots and speed recovery. Some people prefer to use ice alone, heat alone, alternating between ice & heat. Experiment to what works for you. Swelling and bruising can take several weeks to resolve.  It is helpful to take an over-the-counter pain medication regularly for the first few weeks. Choose one of the following that works best for you:  Naproxen (Aleve, etc) Two 220mg tabs twice a day Ibuprofen (Advil, etc) Three 200mg tabs four times a day (every meal & bedtime) Acetaminophen (Tylenol, etc) 500-650mg four times a day (every meal & bedtime) A prescription for pain medication (such as oxycodone, hydrocodone, etc) may be given to you upon discharge. Take your pain medication as prescribed.  If you are having problems/concerns with the prescription medicine (does not control pain, nausea, vomiting, rash, itching, etc), please call us [phone] to see if we need to switch you to a different pain medicine that will work better for you and/or control your side effect better. If you need a refill on your pain medication, please contact your pharmacy. They will contact our office to request authorization. Prescriptions will not be filled after 5 pm or on week-ends. Avoid getting constipated. When taking pain medications, it is common to experience some constipation. Increasing fluid intake and taking a fiber supplement (such as Metamucil, Citrucel, FiberCon, MiraLax, etc) 1-2 times a day regularly will usually help prevent this problem  from occurring. A mild laxative (prune juice, Milk of Magnesia, MiraLax, etc) should be taken according to package directions if there are no bowel movements after 48 hours.  Watch out for diarrhea. If you have many loose bowel movements, simplify your diet to bland foods & liquids for a few days. Stop any stool softeners and decrease your fiber supplement. Switching to mild anti-diarrheal medications (Kayopectate, Pepto Bismol) can help. If this worsens or does not improve, please call us. FOLLOW UP  If a follow up appointment is needed one will be scheduled for you. If none is needed with our trauma team, please follow up with your primary care provider within 2-3 weeks from discharge. Please call [facility] at [phone] if you have any questions about follow up.  If you have any orthopedic or other injuries you will need to follow up as outlined in your follow up instructions.   WHEN TO CALL US [phone]:  Poor pain control Reactions / problems with new medications (rash/itching, nausea, etc)  Fever over 101.5 F (38.5 C) Worsening swelling or bruising Worsening pain, productive cough, difficulty breathing or any other concerning symptoms  The clinic staff is available to answer your questions during regular business hours (8:30am-5pm). Please don't hesitate to call and ask to speak to one of our nurses for clinical concerns.  If you have a medical emergency, go to the nearest emergency room or call 911.  A surgeon from [facility] is always on call at the hospitals   [facility]  [address], [address], [place], [place] [address] ?  MAIN: [phone] ? TOLL FREE: [phone] ?  FAX [phone]  [email or web address]      Information on Rib Fractures  A rib fracture is a break or crack in one of the bones of the ribs. The ribs are long, curved bones that wrap around your chest and attach to your spine and your breastbone. The  ribs protect your heart, lungs, and other organs in the chest. A broken or cracked rib is often painful but is not usually serious. Most rib fractures heal on their own over time. However, rib fractures can be more serious if multiple ribs are broken or if broken ribs move out of place and push against other structures or organs. What are the causes? This condition is caused by: Repetitive movements with high force, such as pitching a baseball or having severe coughing spells. A direct blow to the chest, such as a sports injury, a car accident, or a fall. Cancer that has spread to the bones, which can weaken bones and cause them to break. What are the signs or symptoms? Symptoms of this condition include: Pain when you breathe in or cough. Pain when someone presses on the injured area. Feeling short of breath. How is this diagnosed? This condition is diagnosed with a physical exam and medical history. Imaging tests may also be done, such as: Chest X-ray. CT scan. MRI. Bone scan. Chest ultrasound. How is this treated? Treatment for this condition depends on the severity of the fracture. Most rib fractures usually heal on their own in 1-3 months. Sometimes healing takes longer if there is a cough that does not stop or if there are other activities that make the injury worse (aggravating factors). While you heal, you will be given medicines to control the pain. You will also be taught deep breathing exercises. Severe injuries may require hospitalization or surgery. Follow these instructions at home: Managing pain, stiffness, and swelling If directed, apply ice to the injured area. Put ice in a plastic bag. Place a towel between your skin and the bag. Leave the ice on for 20 minutes, 2-3 times a day. Take over-the-counter and prescription medicines only as told by your health care provider. Activity Avoid a lot of activity and any activities or movements that cause pain. Be careful during  activities and avoid bumping the injured rib. Slowly increase your activity as told by your health care provider. General instructions Do deep breathing exercises as told by your health care provider. This helps prevent pneumonia, which is a common complication of a broken rib. Your health care provider may instruct you to: Take deep breaths several times a day. Try to cough several times a day, holding a pillow against the injured area. Use a device called incentive spirometer to practice deep breathing several times a day. Drink enough fluid to keep your urine pale yellow. Do not wear a rib belt or binder. These restrict breathing, which can lead to pneumonia. Keep all follow-up visits as told by your health care provider. This is important. Contact a health care provider if: You have a fever. Get help right away if: You have difficulty breathing or you are short of breath. You develop a cough that does not stop, or you cough up thick or bloody sputum. You have nausea, vomiting, or pain in your abdomen. Your pain gets worse and medicine does not help. Summary A rib fracture is a break or crack in one of the bones of the ribs. A broken or cracked rib is   often painful but is not usually serious. Most rib fractures heal on their own over time. Treatment for this condition depends on the severity of the fracture. Avoid a lot of activity and any activities or movements that cause pain. This information is not intended to replace advice given to you by your health care provider. Make sure you discuss any questions you have with your health care provider. Document Released: 11/08/2005 Document Revised: 02/07/2017 Document Reviewed: 02/07/2017 Elsevier Interactive Patient Education  2019 [employer]  
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
## 2022-01-20 ENCOUNTER — Encounter (HOSPITAL_COMMUNITY): Payer: Self-pay

## 2022-01-20 LAB — CBC
HCT: 39.7 % (ref 39.0–52.0)
Hemoglobin: 12.7 g/dL — ABNORMAL LOW (ref 13.0–17.0)
MCH: 26.6 pg (ref 26.0–34.0)
MCHC: 32 g/dL (ref 30.0–36.0)
MCV: 83.2 fL (ref 80.0–100.0)
Platelets: 220 10*3/uL (ref 150–400)
RBC: 4.77 MIL/uL (ref 4.22–5.81)
RDW: 14.9 % (ref 11.5–15.5)
WBC: 8.9 10*3/uL (ref 4.0–10.5)
nRBC: 0 % (ref 0.0–0.2)

## 2022-01-20 LAB — BASIC METABOLIC PANEL
Anion gap: 9 (ref 5–15)
BUN: 10 mg/dL (ref 6–20)
CO2: 24 mmol/L (ref 22–32)
Calcium: 8.5 mg/dL — ABNORMAL LOW (ref 8.9–10.3)
Chloride: 106 mmol/L (ref 98–111)
Creatinine, Ser: 1.05 mg/dL (ref 0.61–1.24)
GFR, Estimated: >60 mL/min (ref >60)
Glucose, Bld: 110 mg/dL — ABNORMAL HIGH (ref 70–99)
Potassium: 4.8 mmol/L (ref 3.5–5.1)
Sodium: 139 mmol/L (ref 135–145)

## 2022-01-20 MED ORDER — BACITRACIN ZINC 500 UNIT/GM EX OINT: TOPICAL_OINTMENT | CUTANEOUS | Status: AC

## 2022-01-20 MED ORDER — METHOCARBAMOL 500 MG PO TABS
1000.0000 mg | ORAL_TABLET | Freq: Three times a day (TID) | ORAL | Status: DC
  Administered 2022-01-20: 1000 mg via ORAL
  Filled 2022-01-20: qty 2

## 2022-01-20 MED ORDER — DOCUSATE SODIUM 100 MG PO CAPS: 100.0000 mg | ORAL_CAPSULE | Freq: Two times a day (BID) | ORAL | Status: AC | PRN

## 2022-01-20 MED ORDER — METHOCARBAMOL 1000 MG PO TABS: 1000.0000 mg | ORAL_TABLET | Freq: Four times a day (QID) | ORAL | 0 refills | Status: AC | PRN

## 2022-01-20 MED ORDER — OXYCODONE HCL 5 MG PO TABS
5.0000 mg | ORAL_TABLET | ORAL | 0 refills | Status: AC | PRN
Start: 2022-01-20 — End: 2022-01-25

## 2022-01-20 MED ORDER — ACETAMINOPHEN 500 MG PO TABS
1000.0000 mg | ORAL_TABLET | Freq: Four times a day (QID) | ORAL | Status: DC
  Administered 2022-01-20: 1000 mg via ORAL
  Filled 2022-01-20: qty 2

## 2022-01-20 NOTE — ED Notes (Signed)
Patient given urinal at bed side and sandwich bag. Patient CO hot flashes after standing up to use the urinal, but after reclining in bed, rebounded to normal.
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
## 2022-01-20 NOTE — TOC CAGE-AID Note (Signed)
Transition of Care (TOC) - CAGE-AID Screening ? ? ?Patient Details  ?Name: Justin Jones ?MRN: [id number] ?Date of Birth: 06/05/1966 ? ?Transition of Care (TOC) CM/SW Contact:    ?Unknown Flannigan C Tarpley-Carter, LCSWA ?Phone Number: ?01/20/2022, 7:47 AM ? ? ?Clinical Narrative: ?Pt participated in Cage-Aid.  Pt stated he does not use substance or ETOH.  Pt was not offered resources, due to no usage of substance or ETOH.    ? ?Insurance underwriter, MSW, LCSW-A ?Pronouns:  She/Her/Hers ?[facility] ?Clinical Social Worker ?Direct Number:  [phone] ?[email or web address]  ? ? ?CAGE-AID Screening: ?  ? ?Have You Ever Felt You Ought to Cut Down on Your Drinking or Drug Use?: No ?Have People Annoyed You By Critizing Your Drinking Or Drug Use?: No ?Have You Felt Bad Or Guilty About Your Drinking Or Drug Use?: No ?Have You Ever Had a Drink or Used Drugs First Thing In The Morning to Steady Your Nerves or to Get Rid of a Hangover?: No ?CAGE-AID Score: 0 ? ?Substance Abuse Education Offered: No ? ?  ? ? ? ? ? ? ?
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
## 2022-01-20 NOTE — ED Notes (Signed)
Lovick MD at bedside ?

## 2022-01-21 LAB — HIV ANTIBODY (ROUTINE TESTING W REFLEX): HIV Screen 4th Generation wRfx: NONREACTIVE

## 2022-01-28 ENCOUNTER — Emergency Department (HOSPITAL_COMMUNITY): Payer: No Typology Code available for payment source

## 2022-01-28 ENCOUNTER — Other Ambulatory Visit: Payer: Self-pay

## 2022-01-28 ENCOUNTER — Encounter (HOSPITAL_COMMUNITY): Payer: Self-pay | Admitting: Emergency Medicine

## 2022-01-28 ENCOUNTER — Emergency Department (HOSPITAL_COMMUNITY)
Admission: EM | Admit: 2022-01-28 | Discharge: 2022-01-28 | Disposition: A | Discharge status: Home / Self Care | Payer: No Typology Code available for payment source | Attending: Emergency Medicine | Admitting: Emergency Medicine

## 2022-01-28 DIAGNOSIS — R0789 Other chest pain: Secondary | ICD-10-CM | POA: Insufficient documentation

## 2022-01-28 DIAGNOSIS — M25511 Pain in right shoulder: Secondary | ICD-10-CM | POA: Insufficient documentation

## 2022-01-28 DIAGNOSIS — S42114A Nondisplaced fracture of body of scapula, right shoulder, initial encounter for closed fracture: Secondary | ICD-10-CM | POA: Insufficient documentation

## 2022-01-28 DIAGNOSIS — M549 Dorsalgia, unspecified: Secondary | ICD-10-CM | POA: Insufficient documentation

## 2022-01-28 HISTORY — DX: Essential (primary) hypertension: I10

## 2022-01-28 LAB — CBC
HCT: 44.3 % (ref 39.0–52.0)
Hemoglobin: 14.1 g/dL (ref 13.0–17.0)
MCH: 26.5 pg (ref 26.0–34.0)
MCHC: 31.8 g/dL (ref 30.0–36.0)
MCV: 83.1 fL (ref 80.0–100.0)
Platelets: 243 10*3/uL (ref 150–400)
RBC: 5.33 MIL/uL (ref 4.22–5.81)
RDW: 14.8 % (ref 11.5–15.5)
WBC: 8.5 10*3/uL (ref 4.0–10.5)
nRBC: 0 % (ref 0.0–0.2)

## 2022-01-28 LAB — BASIC METABOLIC PANEL
Anion gap: 10 (ref 5–15)
BUN: 11 mg/dL (ref 6–20)
CO2: 22 mmol/L (ref 22–32)
Calcium: 9.2 mg/dL (ref 8.9–10.3)
Chloride: 107 mmol/L (ref 98–111)
Creatinine, Ser: 0.88 mg/dL (ref 0.61–1.24)
GFR, Estimated: >60 mL/min (ref >60)
Glucose, Bld: 102 mg/dL — ABNORMAL HIGH (ref 70–99)
Potassium: 3.9 mmol/L (ref 3.5–5.1)
Sodium: 139 mmol/L (ref 135–145)

## 2022-01-28 LAB — TROPONIN I (HIGH SENSITIVITY): Troponin I (High Sensitivity): 3 ng/L (ref <18)

## 2022-01-28 MED ORDER — IOHEXOL 300 MG/ML  SOLN
75.0000 mL | Freq: Once | INTRAMUSCULAR | Status: AC | PRN
  Administered 2022-01-28: 11:00:00 75 mL via INTRAVENOUS

## 2022-01-28 MED ORDER — HYDROMORPHONE HCL 1 MG/ML IJ SOLN
1.0000 mg | Freq: Once | INTRAMUSCULAR | Status: AC
  Administered 2022-01-28: 09:00:00 1 mg via INTRAVENOUS
  Filled 2022-01-28: qty 1

## 2022-01-28 MED ORDER — KETOROLAC TROMETHAMINE 15 MG/ML IJ SOLN
15.0000 mg | Freq: Once | INTRAMUSCULAR | Status: AC
  Administered 2022-01-28: 11:00:00 15 mg via INTRAVENOUS
  Filled 2022-01-28: qty 1

## 2022-01-28 NOTE — Discharge Instructions (Addendum)
You may weight-bear as tolerated with the right arm.  You can use the sling for comfort. ? ?If you develop recurrent, continued, or worsening chest pain, shortness of breath, fever, vomiting, abdominal or back pain, or any other new/concerning symptoms then return to the ER for evaluation.  ?

## 2022-01-28 NOTE — ED Notes (Signed)
Patient transported to X-ray 
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
## 2022-01-28 NOTE — ED Notes (Signed)
RN applied sling and EDP came to provide d/c instructions ?

## 2022-01-28 NOTE — ED Notes (Signed)
Pt stated the pain medication has taken the edge off. Pt is watching tv. ?
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
## 2022-06-11 DIAGNOSIS — R7303 Prediabetes: Secondary | ICD-10-CM | POA: Diagnosis not present

## 2022-06-11 DIAGNOSIS — R350 Frequency of micturition: Secondary | ICD-10-CM | POA: Diagnosis not present

## 2022-06-11 DIAGNOSIS — I10 Essential (primary) hypertension: Secondary | ICD-10-CM | POA: Diagnosis not present

## 2022-06-11 DIAGNOSIS — E7800 Pure hypercholesterolemia, unspecified: Secondary | ICD-10-CM | POA: Diagnosis not present

## 2022-06-17 DIAGNOSIS — I10 Essential (primary) hypertension: Secondary | ICD-10-CM | POA: Diagnosis not present

## 2022-06-17 DIAGNOSIS — E1165 Type 2 diabetes mellitus with hyperglycemia: Secondary | ICD-10-CM | POA: Diagnosis not present

## 2022-06-17 DIAGNOSIS — Z125 Encounter for screening for malignant neoplasm of prostate: Secondary | ICD-10-CM | POA: Diagnosis not present

## 2022-06-17 DIAGNOSIS — E669 Obesity, unspecified: Secondary | ICD-10-CM | POA: Diagnosis not present

## 2022-06-23 DIAGNOSIS — R1310 Dysphagia, unspecified: Secondary | ICD-10-CM | POA: Diagnosis not present

## 2022-07-23 ENCOUNTER — Other Ambulatory Visit: Payer: Self-pay | Admitting: Family Medicine

## 2022-07-23 ENCOUNTER — Ambulatory Visit
Admission: RE | Admit: 2022-07-23 | Discharge: 2022-07-23 | Disposition: A | Discharge status: Home / Self Care | Payer: BC Managed Care – PPO | Source: Ambulatory Visit | Attending: Family Medicine | Admitting: Family Medicine

## 2022-07-23 DIAGNOSIS — R053 Chronic cough: Secondary | ICD-10-CM

## 2022-07-23 DIAGNOSIS — R1310 Dysphagia, unspecified: Secondary | ICD-10-CM | POA: Diagnosis not present

## 2022-08-03 DIAGNOSIS — D122 Benign neoplasm of ascending colon: Secondary | ICD-10-CM | POA: Diagnosis not present

## 2022-08-03 DIAGNOSIS — K2970 Gastritis, unspecified, without bleeding: Secondary | ICD-10-CM | POA: Diagnosis not present

## 2022-08-03 DIAGNOSIS — K222 Esophageal obstruction: Secondary | ICD-10-CM | POA: Diagnosis not present

## 2022-08-03 DIAGNOSIS — D124 Benign neoplasm of descending colon: Secondary | ICD-10-CM | POA: Diagnosis not present

## 2022-08-03 DIAGNOSIS — K648 Other hemorrhoids: Secondary | ICD-10-CM | POA: Diagnosis not present

## 2022-08-03 DIAGNOSIS — K5730 Diverticulosis of large intestine without perforation or abscess without bleeding: Secondary | ICD-10-CM | POA: Diagnosis not present

## 2022-08-03 DIAGNOSIS — R1310 Dysphagia, unspecified: Secondary | ICD-10-CM | POA: Diagnosis not present

## 2022-08-03 DIAGNOSIS — Z86010 Personal history of colonic polyps: Secondary | ICD-10-CM | POA: Diagnosis not present

## 2022-08-03 DIAGNOSIS — D123 Benign neoplasm of transverse colon: Secondary | ICD-10-CM | POA: Diagnosis not present

## 2022-08-03 DIAGNOSIS — Z09 Encounter for follow-up examination after completed treatment for conditions other than malignant neoplasm: Secondary | ICD-10-CM | POA: Diagnosis not present

## 2022-08-09 DIAGNOSIS — Z96611 Presence of right artificial shoulder joint: Secondary | ICD-10-CM | POA: Diagnosis not present

## 2022-08-10 DIAGNOSIS — S46011D Strain of muscle(s) and tendon(s) of the rotator cuff of right shoulder, subsequent encounter: Secondary | ICD-10-CM | POA: Diagnosis not present

## 2022-08-12 DIAGNOSIS — R053 Chronic cough: Secondary | ICD-10-CM | POA: Diagnosis not present

## 2022-08-12 DIAGNOSIS — R1310 Dysphagia, unspecified: Secondary | ICD-10-CM | POA: Diagnosis not present

## 2022-08-12 DIAGNOSIS — J302 Other seasonal allergic rhinitis: Secondary | ICD-10-CM | POA: Diagnosis not present

## 2022-08-12 DIAGNOSIS — E1165 Type 2 diabetes mellitus with hyperglycemia: Secondary | ICD-10-CM | POA: Diagnosis not present

## 2022-09-03 DIAGNOSIS — S46011D Strain of muscle(s) and tendon(s) of the rotator cuff of right shoulder, subsequent encounter: Secondary | ICD-10-CM | POA: Diagnosis not present

## 2022-09-08 DIAGNOSIS — S46011D Strain of muscle(s) and tendon(s) of the rotator cuff of right shoulder, subsequent encounter: Secondary | ICD-10-CM | POA: Diagnosis not present

## 2022-09-13 DIAGNOSIS — S46011D Strain of muscle(s) and tendon(s) of the rotator cuff of right shoulder, subsequent encounter: Secondary | ICD-10-CM | POA: Diagnosis not present

## 2022-09-21 DIAGNOSIS — S46011D Strain of muscle(s) and tendon(s) of the rotator cuff of right shoulder, subsequent encounter: Secondary | ICD-10-CM | POA: Diagnosis not present

## 2022-10-01 DIAGNOSIS — E1165 Type 2 diabetes mellitus with hyperglycemia: Secondary | ICD-10-CM | POA: Diagnosis not present

## 2022-10-01 DIAGNOSIS — M25562 Pain in left knee: Secondary | ICD-10-CM | POA: Diagnosis not present

## 2022-10-01 DIAGNOSIS — I10 Essential (primary) hypertension: Secondary | ICD-10-CM | POA: Diagnosis not present

## 2022-10-01 DIAGNOSIS — E7800 Pure hypercholesterolemia, unspecified: Secondary | ICD-10-CM | POA: Diagnosis not present

## 2022-10-01 DIAGNOSIS — S46011D Strain of muscle(s) and tendon(s) of the rotator cuff of right shoulder, subsequent encounter: Secondary | ICD-10-CM | POA: Diagnosis not present

## 2022-10-01 DIAGNOSIS — Z0000 Encounter for general adult medical examination without abnormal findings: Secondary | ICD-10-CM | POA: Diagnosis not present

## 2022-10-04 DIAGNOSIS — Z96611 Presence of right artificial shoulder joint: Secondary | ICD-10-CM | POA: Diagnosis not present

## 2022-10-05 DIAGNOSIS — S46011D Strain of muscle(s) and tendon(s) of the rotator cuff of right shoulder, subsequent encounter: Secondary | ICD-10-CM | POA: Diagnosis not present

## 2022-10-06 DIAGNOSIS — M1712 Unilateral primary osteoarthritis, left knee: Secondary | ICD-10-CM | POA: Diagnosis not present

## 2022-10-06 DIAGNOSIS — M25562 Pain in left knee: Secondary | ICD-10-CM | POA: Diagnosis not present

## 2023-04-29 DIAGNOSIS — I10 Essential (primary) hypertension: Secondary | ICD-10-CM | POA: Diagnosis not present

## 2023-04-29 DIAGNOSIS — E1165 Type 2 diabetes mellitus with hyperglycemia: Secondary | ICD-10-CM | POA: Diagnosis not present

## 2023-04-29 DIAGNOSIS — E7800 Pure hypercholesterolemia, unspecified: Secondary | ICD-10-CM | POA: Diagnosis not present

## 2023-04-29 DIAGNOSIS — M545 Low back pain, unspecified: Secondary | ICD-10-CM | POA: Diagnosis not present

## 2023-04-29 DIAGNOSIS — E669 Obesity, unspecified: Secondary | ICD-10-CM | POA: Diagnosis not present

## 2023-07-19 DIAGNOSIS — K529 Noninfective gastroenteritis and colitis, unspecified: Secondary | ICD-10-CM | POA: Diagnosis not present

## 2023-07-19 DIAGNOSIS — B349 Viral infection, unspecified: Secondary | ICD-10-CM | POA: Diagnosis not present

## 2023-07-19 DIAGNOSIS — Z03818 Encounter for observation for suspected exposure to other biological agents ruled out: Secondary | ICD-10-CM | POA: Diagnosis not present

## 2023-07-30 ENCOUNTER — Emergency Department
Admission: EM | Admit: 2023-07-30 | Discharge: 2023-07-30 | Disposition: A | Discharge status: Home / Self Care | Payer: BC Managed Care – PPO | Attending: Emergency Medicine | Admitting: Emergency Medicine

## 2023-07-30 ENCOUNTER — Emergency Department: Payer: BC Managed Care – PPO

## 2023-07-30 DIAGNOSIS — Z96611 Presence of right artificial shoulder joint: Secondary | ICD-10-CM | POA: Diagnosis not present

## 2023-07-30 DIAGNOSIS — M25511 Pain in right shoulder: Secondary | ICD-10-CM | POA: Insufficient documentation

## 2023-07-30 DIAGNOSIS — M546 Pain in thoracic spine: Secondary | ICD-10-CM | POA: Insufficient documentation

## 2023-07-30 DIAGNOSIS — S2241XA Multiple fractures of ribs, right side, initial encounter for closed fracture: Secondary | ICD-10-CM | POA: Diagnosis not present

## 2023-07-30 DIAGNOSIS — R0789 Other chest pain: Secondary | ICD-10-CM | POA: Diagnosis not present

## 2023-07-30 DIAGNOSIS — R079 Chest pain, unspecified: Secondary | ICD-10-CM | POA: Diagnosis not present

## 2023-07-30 DIAGNOSIS — J9811 Atelectasis: Secondary | ICD-10-CM | POA: Diagnosis not present

## 2023-07-30 LAB — CBC WITH DIFFERENTIAL/PLATELET
Abs Immature Granulocytes: 0.04 10*3/uL (ref 0.00–0.07)
Basophils Absolute: 0.1 10*3/uL (ref 0.0–0.1)
Basophils Relative: 1 %
Eosinophils Absolute: 0.3 10*3/uL (ref 0.0–0.5)
Eosinophils Relative: 3 %
HCT: 42.6 % (ref 39.0–52.0)
Hemoglobin: 13.7 g/dL (ref 13.0–17.0)
Immature Granulocytes: 1 %
Lymphocytes Relative: 35 %
Lymphs Abs: 2.8 10*3/uL (ref 0.7–4.0)
MCH: 26.4 pg (ref 26.0–34.0)
MCHC: 32.2 g/dL (ref 30.0–36.0)
MCV: 82.2 fL (ref 80.0–100.0)
Monocytes Absolute: 0.5 10*3/uL (ref 0.1–1.0)
Monocytes Relative: 6 %
Neutro Abs: 4.2 10*3/uL (ref 1.7–7.7)
Neutrophils Relative %: 54 %
Platelets: 221 10*3/uL (ref 150–400)
RBC: 5.18 MIL/uL (ref 4.22–5.81)
RDW: 14.6 % (ref 11.5–15.5)
WBC: 7.8 10*3/uL (ref 4.0–10.5)
nRBC: 0 % (ref 0.0–0.2)

## 2023-07-30 LAB — BASIC METABOLIC PANEL
Anion gap: 12 (ref 5–15)
BUN: 10 mg/dL (ref 6–20)
CO2: 22 mmol/L (ref 22–32)
Calcium: 9 mg/dL (ref 8.9–10.3)
Chloride: 105 mmol/L (ref 98–111)
Creatinine, Ser: 1 mg/dL (ref 0.61–1.24)
GFR, Estimated: >60 mL/min (ref >60)
Glucose, Bld: 148 mg/dL — ABNORMAL HIGH (ref 70–99)
Potassium: 3.1 mmol/L — ABNORMAL LOW (ref 3.5–5.1)
Sodium: 139 mmol/L (ref 135–145)

## 2023-07-30 LAB — TROPONIN I (HIGH SENSITIVITY): Troponin I (High Sensitivity): 4 ng/L (ref <18)

## 2023-07-30 MED ORDER — IBUPROFEN 600 MG PO TABS
600.0000 mg | ORAL_TABLET | Freq: Once | ORAL | Status: AC
  Administered 2023-07-30: 600 mg via ORAL
  Filled 2023-07-30: qty 1
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
## 2023-07-30 NOTE — ED Provider Notes (Signed)
[facility] Provider Note   Event Date/Time   First MD Initiated Contact with Patient 07/30/23 1809     (approximate) History  Fall (Patient was running from a dog towards his car (he is an Pensions consultant) and hit his car with his chest then fell backwards landing on his back; No loss of consciousness but states that he did hit head head on the ground; Reports chest pain and RIGHT sided back pain)  HPI Justin Jones is a 57 y.o. male who presents after running into a car striking his chest and then falling backwards onto his back.  Patient now complains of anterior chest wall pain, right shoulder pain, and bilateral thoracic back pain.  Patient denies any loss of consciousness.  Patient states that he did hit his head but denies any headache or contusion.  Patient denies any blood thinner use. ROS: Patient currently denies any vision changes, tinnitus, difficulty speaking, facial droop, sore throat, chest pain, shortness of breath, abdominal pain, nausea/vomiting/diarrhea, dysuria, or weakness/numbness/paresthesias in any extremity   Physical Exam  Triage Vital Signs: ED Triage Vitals  Encounter Vitals Group     BP 07/30/23 1805 127/78     Systolic BP Percentile --      Diastolic BP Percentile --      Pulse Rate 07/30/23 1805 75     Resp 07/30/23 1805 (!) 21     Temp 07/30/23 1805 98.5 F (36.9 C)     Temp Source 07/30/23 1805 Oral     SpO2 07/30/23 1805 95 %     Weight 07/30/23 1800 258 lb (117 kg)     Height 07/30/23 1800 5\' 10"  (1.778 m)     Head Circumference --      Peak Flow --      Pain Score --      Pain Loc --      Pain Education --      Exclude from Growth Chart --    Most recent vital signs: Vitals:   07/30/23 1930 07/30/23 1933  BP:  121/68  Pulse: 66   Resp:    Temp:    SpO2: 100%    General: Awake, oriented x4. CV:  Good peripheral perfusion.  Resp:  Normal effort.  Abd:  No distention.  Other:  Middle-aged obese  African-American male laying in bed resting comfortably in no acute distress.  Mildly limited range of motion at the right shoulder secondary to pain ED Results / Procedures / Treatments  Labs (all labs ordered are listed, but only abnormal results are displayed) Labs Reviewed  BASIC METABOLIC PANEL - Abnormal; Notable for the following components:      Result Value   Potassium 3.1 (*)    Glucose, Bld 148 (*)    All other components within normal limits  CBC WITH DIFFERENTIAL/PLATELET  URINALYSIS, ROUTINE W REFLEX MICROSCOPIC  TROPONIN I (HIGH SENSITIVITY)  TROPONIN I (HIGH SENSITIVITY)   EKG ED ECG REPORT I, Merwyn Katos, the attending physician, personally viewed and interpreted this ECG. Date: 07/30/2023 EKG Time: 1801 Rate: 74 Rhythm: normal sinus rhythm QRS Axis: normal Intervals: normal ST/T Wave abnormalities: normal Narrative Interpretation: no evidence of acute ischemia RADIOLOGY ED MD interpretation: 2 view chest x-ray interpreted independently by me and shows very mild left basilar atelectasis -Agree with radiology assessment Official radiology report(s): DG Chest 2 View  Result Date: 07/30/2023 CLINICAL DATA:  Status post trauma. EXAM: CHEST - 2 VIEW COMPARISON:  July 23, 2022 FINDINGS: The heart size and mediastinal contours are within normal limits. Very mild atelectasis is seen within the left lung base. There is no evidence of a pleural effusion or pneumothorax. Multiple chronic right-sided rib fractures are seen. An intact right shoulder replacement is also noted. IMPRESSION: Very mild left basilar atelectasis. Electronically Signed   By: Aram Candela M.D.   On: 07/30/2023 19:16   PROCEDURES: Critical Care performed: No .1-3 Lead EKG Interpretation  Performed by: Merwyn Katos, MD Authorized by: Merwyn Katos, MD     Interpretation: normal     ECG rate:  71   ECG rate assessment: normal     Rhythm: sinus rhythm     Ectopy: none      Conduction: normal    MEDICATIONS ORDERED IN ED: Medications  ibuprofen (ADVIL) tablet 600 mg (600 mg Oral Given 07/30/23 1850)   IMPRESSION / MDM / ASSESSMENT AND PLAN / ED COURSE  I reviewed the triage vital signs and the nursing notes.                             The patient is on the cardiac monitor to evaluate for evidence of arrhythmia and/or significant heart rate changes. Patient's presentation is most consistent with acute presentation with potential threat to life or bodily function. Complaining of pain to : Anterior chest wall, thoracic back, and right shoulder  Given history, exam, and workup, low suspicion for ICH, skull fx, spine fx or other acute spinal syndrome, PTX, pulmonary contusion, cardiac contusion, aortic/vertebral dissection, hollow organ injury, acute traumatic abdomen, significant hemorrhage, extremity fracture.  Workup: Imaging: Defer CT brain and c-spine: normal neuro exam, lack of midline spinal TTP, non-severe mechanism, age < 52 Defer FAST: vitals WNL, no abdominal tenderness or external signs of trauma, non-severe mechanism X-ray of the chest does not show any evidence of acute fracture or dislocation.  Disposition: Expected transient and self limiting course for pain discussed with patient. Prompt follow up with primary care physician discussed. Discharge home.   FINAL CLINICAL IMPRESSION(S) / ED DIAGNOSES   Final diagnoses:  Anterior chest wall pain  Acute pain of right shoulder  Acute bilateral thoracic back pain   Rx / DC Orders   ED Discharge Orders     None      Note:  This document was prepared using Dragon voice recognition software and may include unintentional dictation errors.   Merwyn Katos, MD 07/30/23 [phone]

## 2023-08-01 NOTE — Group Note (Deleted)

## 2023-12-30 DIAGNOSIS — Z125 Encounter for screening for malignant neoplasm of prostate: Secondary | ICD-10-CM | POA: Diagnosis not present

## 2023-12-30 DIAGNOSIS — M170 Bilateral primary osteoarthritis of knee: Secondary | ICD-10-CM | POA: Diagnosis not present

## 2023-12-30 DIAGNOSIS — E7800 Pure hypercholesterolemia, unspecified: Secondary | ICD-10-CM | POA: Diagnosis not present

## 2023-12-30 DIAGNOSIS — N5082 Scrotal pain: Secondary | ICD-10-CM | POA: Diagnosis not present

## 2023-12-30 DIAGNOSIS — Z0000 Encounter for general adult medical examination without abnormal findings: Secondary | ICD-10-CM | POA: Diagnosis not present

## 2023-12-30 DIAGNOSIS — I10 Essential (primary) hypertension: Secondary | ICD-10-CM | POA: Diagnosis not present

## 2023-12-30 DIAGNOSIS — Z23 Encounter for immunization: Secondary | ICD-10-CM | POA: Diagnosis not present

## 2023-12-30 DIAGNOSIS — E1165 Type 2 diabetes mellitus with hyperglycemia: Secondary | ICD-10-CM | POA: Diagnosis not present

## 2023-12-30 DIAGNOSIS — D126 Benign neoplasm of colon, unspecified: Secondary | ICD-10-CM | POA: Diagnosis not present

## 2024-01-02 ENCOUNTER — Other Ambulatory Visit: Payer: Self-pay | Admitting: Family Medicine

## 2024-01-02 DIAGNOSIS — N5082 Scrotal pain: Secondary | ICD-10-CM

## 2024-01-03 ENCOUNTER — Other Ambulatory Visit: Payer: BC Managed Care – PPO

## 2024-01-06 ENCOUNTER — Ambulatory Visit
Admission: RE | Admit: 2024-01-06 | Discharge: 2024-01-06 | Disposition: A | Discharge status: Home / Self Care | Payer: BC Managed Care – PPO | Source: Ambulatory Visit | Attending: Family Medicine | Admitting: Family Medicine

## 2024-01-06 DIAGNOSIS — N5082 Scrotal pain: Secondary | ICD-10-CM

## 2024-01-09 ENCOUNTER — Ambulatory Visit: Payer: BC Managed Care – PPO | Admitting: Podiatry

## 2024-01-12 ENCOUNTER — Ambulatory Visit: Payer: BC Managed Care – PPO | Admitting: Podiatry

## 2024-01-12 ENCOUNTER — Ambulatory Visit (INDEPENDENT_AMBULATORY_CARE_PROVIDER_SITE_OTHER): Payer: BC Managed Care – PPO

## 2024-01-12 DIAGNOSIS — M778 Other enthesopathies, not elsewhere classified: Secondary | ICD-10-CM

## 2024-01-12 DIAGNOSIS — E119 Type 2 diabetes mellitus without complications: Secondary | ICD-10-CM

## 2024-01-12 DIAGNOSIS — M21619 Bunion of unspecified foot: Secondary | ICD-10-CM | POA: Diagnosis not present

## 2024-01-12 DIAGNOSIS — L989 Disorder of the skin and subcutaneous tissue, unspecified: Secondary | ICD-10-CM

## 2024-01-12 NOTE — Progress Notes (Signed)
Subjective:   Patient ID: Justin Jones, male   DOB: 58 y.o.   MRN: [id number]   HPI Chief Complaint  Patient presents with   RFC    RM#11 RFC patient states would like to have feet looked at over all has a spot on the back of big right toe giving him some discomfort.   58 year old male presents the office with above concerns.  States he is diabetic and both of his feet checked.  He has a spot on the bottom of his right big toe which is ongoing for some time.  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  States he does mean left foot pain since August after he had his foot with some swelling the last month.  He points to the first MTPJ where he gets the discomfort.  He has not had any recent treatment.  He points to an ongoing for some time but recently started to cause discomfort.  He does not check his blood sugar at home.  Unsure of A1c.   Review of Systems  All other systems reviewed and are negative.   Past Medical History:  Diagnosis Date   GERD (gastroesophageal reflux disease)    Hypertension     Past Surgical History:  Procedure Laterality Date   none       Current Outpatient Medications:    docusate sodium (COLACE) 100 MG capsule, Take 1 capsule (100 mg total) by mouth 2 (two) times daily as needed for mild constipation or moderate constipation., Disp: , Rfl:    famotidine (PEPCID) 20 MG tablet, One at bedtime, Disp: 30 tablet, Rfl: 0  No Known Allergies       Objective:  Physical Exam  General: AAO x3, NAD  Dermatological: Annular hyperkeratotic lesion of the distal plantar aspect the right hallux.  Upon debridement there is no underlying ulceration, drainage or any signs of infection.  No open lesions (present.   Vascular: Dorsalis Pedis artery and Posterior Tibial artery pedal pulses are 2/4 bilateral with immedate capillary fill time. There is no pain with calf compression, swelling, warmth, erythema.   Neruologic: Grossly intact  via light touch bilateral.  Sensation intact with Semmes Weinstein monofilament  Musculoskeletal: Moderate bunions are present bilaterally.  Mild reduction in first MPJ range of motion noted.  There is no pain with MTPJ range of motion. There is no area pinpoint tenderness identified today otherwise to the foot or ankle.  Flatfoot present.      Assessment:   Skin lesion right hallux, bunion/first MTPJ arthritis     Plan:  -Treatment options discussed including all alternatives, risks, and complications -Etiology of symptoms were discussed -X-rays were obtained and reviewed with the patient.  X-rays obtained reviewed bilaterally.  There is decreased joint space narrowing the first MTPJ.  There was no evidence of acute fracture.  Skin lesion -Sharply debrided lesion with any complications or bleeding.  No evidence of foreign body, puncture wound.  Uniform color.  Cleaned area with alcohol.  Salicylic acid applied followed by bandage.  Postprocedure instructions discussed.  Monitor for any signs or symptoms of infection  Bunion/swelling -Discussed etiology of her hands as well as some arthritis noted on x-ray.  We discussed with conservative as well as surgical options.  Will start with conservative treatment.  Discussed Voltaren gel as well as shoe modifications avoid excess pressure.  Dispensed gel bunion pads.  If symptoms persist we discussed surgical invention.  Discussed making  an intraoperative decision to do a bunionectomy versus first MTPJ arthrodesis should he elect to proceed with surgery.  Diabetes -Daily foot inspection  Return in about 2 months (around 03/11/2024).  Vivi Barrack DPM

## 2024-01-12 NOTE — Patient Instructions (Addendum)
Keep the bandage on for 24 hours. At that time, remove and clean with soap and water. If it hurts or burns before 24 hours go ahead and remove the bandage and wash with soap and water. Keep the area clean. If there is any blistering cover with antibiotic ointment and a bandage. Monitor for any redness, drainage, or other signs of infection. Call the office if any are to occur. If you have any questions, please call the office at [phone].  --  You can use VOLTAREN GEL on the area of the bunions as needed. You can purchase this over the counter.   --   Bunion: What to Know A bunion, or hallux valgus, is a bump that forms slowly on the inner side of your big toe joint. It happens when your big toe turns toward your second toe. Bunions may be small at first but get bigger over time. They can make walking painful. What are the causes? A bunion may be caused by: Wearing narrow or pointed shoes that force your big toe to press against the other toes. Problems with how your foot is shaped. Changes in your foot caused by some diseases or conditions. A foot injury. What increases the risk? You're more likely to get a bunion if: You wear shoes that squeeze your toes. You have certain diseases, such as: Rheumatoid arthritis. Cerebral palsy. Someone in your family gets bunions too. You have flat feet or low arches. You do things that put a lot of pressure on your feet, such as ballet. What are the signs or symptoms? The main symptom is a bump on the inner side of your big toe. You may also have: Pain. Redness and swelling around your big toe. Thick or hard skin on your big toe or between your toes. Stiffness or loss of movement in your big toe. Trouble walking. How is this diagnosed? A bunion may be diagnosed based on your symptoms, medical history, and activities.  You may also have tests, such as an X-ray. This helps your health care provider see the bones in your foot and look for  damage to your joint. How is this treated? Treatment can help with symptoms and can stop the bunion from getting worse. What you need to do may depend on how bad your symptoms are. You may need to: Wear shoes that have a wide toe box. Use bunion pads to cushion your toes. Tape your toes together. Place an insert called an orthotic device in your shoe. This can help take pressure off your toe joint. Take medicine to help with pain and swelling. Put ice or heat on your foot. Do stretching exercises. Have surgery. You may need this if the bunion is causing very bad symptoms. Follow these instructions at home: Managing pain, stiffness, and swelling     Use ice or an ice pack as told. Place a towel between your skin and the ice. Leave the ice on for 20 minutes, 2-3 times a day. Use heat as told. Use the heat source that your provider recommends, such as a moist heat pack or a heating pad. Do this as often as told. Place a towel between your skin and the heat source. Leave the heat on for 20-30 minutes. If your skin turns red, take off the ice or heat right away to prevent skin damage. The risk of damage is higher if you can't feel pain, heat, or cold. General instructions Exercise as told. Support your toe joint as told  with: The right footwear. Shoe padding. Taping. Wear shoes that have a wide toe box. Avoid wearing tight shoes or shoes with high heels. Take your medicines only as told. Do not smoke, vape, or use nicotine or tobacco. Keep all follow-up visits. Your provider will check if the treatments are working. Contact a health care provider if: Your symptoms get worse. Your symptoms don't get better in 2 weeks. Get help right away if: You have very bad pain and trouble walking. This information is not intended to replace advice given to you by your health care provider. Make sure you discuss any questions you have with your health care provider. Document Revised: 05/27/2023  Document Reviewed: 05/27/2023 Elsevier Patient Education  2024 [employer] Inc.  --  Diabetes Mellitus and Foot Care Diabetes, also called diabetes mellitus, may cause problems with your feet and legs because of poor blood flow (circulation). Poor circulation may make your skin: Become thinner and drier. Break more easily. Heal more slowly. Peel and crack. You may also have nerve damage (neuropathy). This can cause decreased feeling in your legs and feet. This means that you may not notice minor injuries to your feet that could lead to more serious problems. Finding and treating problems early is the best way to prevent future foot problems. How to care for your feet Foot hygiene  Wash your feet daily with warm water and mild soap. Do not use hot water. Then, pat your feet and the areas between your toes until they are fully dry. Do not soak your feet. This can dry your skin. Trim your toenails straight across. Do not dig under them or around the cuticle. File the edges of your nails with an emery board or nail file. Apply a moisturizing lotion or petroleum jelly to the skin on your feet and to dry, brittle toenails. Use lotion that does not contain alcohol and is unscented. Do not apply lotion between your toes. Shoes and socks Wear clean socks or stockings every day. Make sure they are not too tight. Do not wear knee-high stockings. These may decrease blood flow to your legs. Wear shoes that fit well and have enough cushioning. Always look in your shoes before you put them on to be sure there are no objects inside. To break in new shoes, wear them for just a few hours a day. This prevents injuries on your feet. Wounds, scrapes, corns, and calluses  Check your feet daily for blisters, cuts, bruises, sores, and redness. If you cannot see the bottom of your feet, use a mirror or ask someone for help. Do not cut off corns or calluses or try to remove them with medicine. If you find a minor  scrape, cut, or break in the skin on your feet, keep it and the skin around it clean and dry. You may clean these areas with mild soap and water. Do not clean the area with peroxide, alcohol, or iodine. If you have a wound, scrape, corn, or callus on your foot, look at it several times a day to make sure it is healing and not infected. Check for: Redness, swelling, or pain. Fluid or blood. Warmth. Pus or a bad smell. General tips Do not cross your legs. This may decrease blood flow to your feet. Do not use heating pads or hot water bottles on your feet. They may burn your skin. If you have lost feeling in your feet or legs, you may not know this is happening until it is too  late. Protect your feet from hot and cold by wearing shoes, such as at the beach or on hot pavement. Schedule a complete foot exam at least once a year or more often if you have foot problems. Report any cuts, sores, or bruises to your health care provider right away. Where to find more information American Diabetes Association: diabetes.org Association of Diabetes Care & Education Specialists: diabeteseducator.org Contact a health care provider if: You have a condition that increases your risk of infection, and you have any cuts, sores, or bruises on your feet. You have an injury that is not healing. You have redness on your legs or feet. You feel burning or tingling in your legs or feet. You have pain or cramps in your legs and feet. Your legs or feet are numb. Your feet always feel cold. You have pain around any toenails. Get help right away if: You have a wound, scrape, corn, or callus on your foot and: You have signs of infection. You have a fever. You have a red line going up your leg. This information is not intended to replace advice given to you by your health care provider. Make sure you discuss any questions you have with your health care provider. Document Revised: 05/12/2022 Document Reviewed:  05/12/2022 Elsevier Patient Education  2024 [employer].

## 2024-01-16 ENCOUNTER — Ambulatory Visit: Payer: BC Managed Care – PPO | Admitting: Podiatry

## 2024-03-19 ENCOUNTER — Ambulatory Visit: Payer: BC Managed Care – PPO | Admitting: Podiatry

## 2024-05-27 ENCOUNTER — Encounter (HOSPITAL_COMMUNITY): Payer: Self-pay

## 2024-05-27 ENCOUNTER — Emergency Department (HOSPITAL_COMMUNITY)

## 2024-05-27 ENCOUNTER — Other Ambulatory Visit: Payer: Self-pay

## 2024-05-27 ENCOUNTER — Emergency Department (HOSPITAL_COMMUNITY)
Admission: EM | Admit: 2024-05-27 | Discharge: 2024-05-27 | Disposition: A | Discharge status: Home / Self Care | Attending: Emergency Medicine | Admitting: Emergency Medicine

## 2024-05-27 DIAGNOSIS — M25532 Pain in left wrist: Secondary | ICD-10-CM | POA: Insufficient documentation

## 2024-05-27 DIAGNOSIS — M25442 Effusion, left hand: Secondary | ICD-10-CM | POA: Insufficient documentation

## 2024-05-27 MED ORDER — OXYCODONE-ACETAMINOPHEN 5-325 MG PO TABS
2.0000 | ORAL_TABLET | Freq: Once | ORAL | Status: AC
  Administered 2024-05-27: 2 via ORAL
  Filled 2024-05-27: qty 2

## 2024-05-27 MED ORDER — COLCHICINE 0.6 MG PO TABS: 0.6000 mg | ORAL_TABLET | Freq: Two times a day (BID) | ORAL | 0 refills | Status: AC

## 2024-05-27 MED ORDER — OXYCODONE-ACETAMINOPHEN 5-325 MG PO TABS: 1.0000 | ORAL_TABLET | Freq: Four times a day (QID) | ORAL | 0 refills | Status: AC | PRN

## 2024-05-27 NOTE — ED Provider Notes (Signed)
 [facility]-EMERGENCY DEPT [facility] Emergency Department Provider Note MRN:  [id number]  Arrival date & time: 05/27/24     Chief Complaint   Hand Pain   History of Present Illness   Justin Jones is a 58 y.o. year-old male presents to the ED with chief complaint of pain and swelling to the left hand and wrist.  Pain is primarily located at the thenar eminence.  He denies any injury or trauma.  Denies ever having had this problem before.  Denies fevers or chills.  Denies any successful treatments prior to arrival.  Denies numbness, weakness, or tingling.  History provided by patient.   Review of Systems  Pertinent positive and negative review of systems noted in HPI.    Physical Exam   Vitals:   05/27/24 0149  BP: (!) 143/86  Pulse: (!) 53  Resp: 19  Temp: 98.4 F (36.9 C)  SpO2: 100%    CONSTITUTIONAL:  non toxic-appearing, NAD NEURO:  Alert and oriented x 3, CN 3-12 grossly intact EYES:  eyes equal and reactive ENT/NECK:  Supple, no stridor  CARDIO:  appears well-perfused  PULM:  No respiratory distress,  GI/GU:  non-distended,  MSK/SPINE:  No gross deformities, no edema, moves all extremities, TTP of the thenar eminence SKIN:  no rash, atraumatic   *Additional and/or pertinent findings included in MDM below  Diagnostic and Interventional Summary    EKG Interpretation Date/Time:    Ventricular Rate:    PR Interval:    QRS Duration:    QT Interval:    QTC Calculation:   R Axis:      Text Interpretation:         Labs Reviewed - No data to display  DG Wrist Complete Left  Final Result    DG Hand Complete Left  Final Result      Medications  oxyCODONE -acetaminophen  (PERCOCET/ROXICET) 5-325 MG per tablet 2 tablet (2 tablets Oral Given 05/27/24 0246)     Procedures  /  Critical Care Procedures  ED Course and Medical Decision Making  I have reviewed the triage vital signs, the nursing notes, and pertinent available records from the  EMR.  Social Determinants Affecting Complexity of Care: Patient has no clinically significant social determinants affecting this chief complaint..   ED Course:    Medical Decision Making Patient here with atraumatic left wrist pain.  No evidence of infection.  He has intact distal pulses and good perfusion to his fingertips.  There are some very mild swelling.  Plain films are negative.  Denies any history of gout, but his symptoms are worrisome for gout flare.  Will treat with colchicine .  Last BMP was normal.  Will give some Percocet and a wrist splint.  Recommend PCP follow-up.  Amount and/or Complexity of Data Reviewed Radiology: ordered.  Risk Prescription drug management.         Consultants: No consultations were needed in caring for this patient.   Treatment and Plan: Emergency department workup does not suggest an emergent condition requiring admission or immediate intervention beyond  what has been performed at this time. The patient is safe for discharge and has  been instructed to return immediately for worsening symptoms, change in  symptoms or any other concerns    Final Clinical Impressions(s) / ED Diagnoses     ICD-10-CM   1. Left wrist pain  M25.532       ED Discharge Orders          Ordered  oxyCODONE -acetaminophen  (PERCOCET/ROXICET) 5-325 MG tablet  Every 6 hours PRN        05/27/24 0407    colchicine  0.6 MG tablet  2 times daily        05/27/24 0407              Discharge Instructions Discussed with and Provided to Patient:     Discharge Instructions      Given the absence of trauma, evidence of infection, negative x-rays, your symptoms are thought to be associated with gout.  Please take medications as prescribed.  Please follow-up with your regular doctor.       Vicky Charleston, PA-C 05/27/24 0408    Trine Raynell Moder, MD 05/27/24 [phone]

## 2024-05-27 NOTE — Discharge Instructions (Addendum)
 Given the absence of trauma, evidence of infection, negative x-rays, your symptoms are thought to be associated with gout.  Please take medications as prescribed.  Please follow-up with your regular doctor.

## 2024-05-27 NOTE — ED Triage Notes (Signed)
 Pt reports L hand pain and swelling x1 day. No injury reported.

## 2024-09-25 DIAGNOSIS — I10 Essential (primary) hypertension: Secondary | ICD-10-CM | POA: Diagnosis not present

## 2024-09-25 DIAGNOSIS — E7800 Pure hypercholesterolemia, unspecified: Secondary | ICD-10-CM | POA: Diagnosis not present

## 2024-09-25 DIAGNOSIS — E1165 Type 2 diabetes mellitus with hyperglycemia: Secondary | ICD-10-CM | POA: Diagnosis not present

## 2024-09-25 DIAGNOSIS — Z23 Encounter for immunization: Secondary | ICD-10-CM | POA: Diagnosis not present

## 2024-09-25 DIAGNOSIS — M170 Bilateral primary osteoarthritis of knee: Secondary | ICD-10-CM | POA: Diagnosis not present

## 2024-12-18 ENCOUNTER — Telehealth: Payer: Self-pay | Admitting: Gastroenterology
